# Patient Record
Sex: Male | Born: 1980 | Hispanic: Yes | Marital: Married | Smoking: Never smoker
Health system: Southern US, Community
[De-identification: ages and names within clinical notes are randomized; demographics above are authoritative.]

## PROBLEM LIST (undated history)

## (undated) DIAGNOSIS — F329 Major depressive disorder, single episode, unspecified: Secondary | ICD-10-CM

## (undated) DIAGNOSIS — F419 Anxiety disorder, unspecified: Secondary | ICD-10-CM

## (undated) DIAGNOSIS — F32A Depression, unspecified: Secondary | ICD-10-CM

## (undated) HISTORY — PX: NO PAST SURGERIES: SHX2092

---
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
## 2019-05-05 ENCOUNTER — Other Ambulatory Visit: Payer: Self-pay | Admitting: Internal Medicine

## 2019-05-05 DIAGNOSIS — R109 Unspecified abdominal pain: Secondary | ICD-10-CM

## 2019-05-12 ENCOUNTER — Ambulatory Visit
Admission: RE | Admit: 2019-05-12 | Discharge: 2019-05-12 | Disposition: A | Discharge status: Home / Self Care | Payer: Managed Care, Other (non HMO) | Source: Ambulatory Visit | Attending: Internal Medicine | Admitting: Internal Medicine

## 2019-05-12 DIAGNOSIS — R109 Unspecified abdominal pain: Secondary | ICD-10-CM

## 2019-05-15 ENCOUNTER — Other Ambulatory Visit: Payer: Self-pay | Admitting: Internal Medicine

## 2019-05-15 DIAGNOSIS — N2889 Other specified disorders of kidney and ureter: Secondary | ICD-10-CM

## 2019-05-21 ENCOUNTER — Other Ambulatory Visit: Payer: Self-pay | Admitting: Urology

## 2019-05-21 ENCOUNTER — Other Ambulatory Visit (HOSPITAL_COMMUNITY): Payer: Self-pay | Admitting: Urology

## 2019-05-21 DIAGNOSIS — D49511 Neoplasm of unspecified behavior of right kidney: Secondary | ICD-10-CM

## 2019-05-25 ENCOUNTER — Other Ambulatory Visit (HOSPITAL_COMMUNITY): Payer: Self-pay | Admitting: Urology

## 2019-05-25 DIAGNOSIS — D49519 Neoplasm of unspecified behavior of unspecified kidney: Secondary | ICD-10-CM

## 2019-05-29 ENCOUNTER — Other Ambulatory Visit: Payer: Managed Care, Other (non HMO)

## 2019-06-02 NOTE — Patient Instructions (Addendum)
DUE TO COVID-19 ONLY ONE VISITOR IS ALLOWED TO COME WITH YOU AND STAY IN THE WAITING ROOM ONLY DURING PRE OP AND PROCEDURE DAY OF SURGERY. THE 1 VISITOR MAY VISIT WITH YOU AFTER SURGERY IN YOUR PRIVATE ROOM DURING VISITING HOURS ONLY!  YOU NEED TO HAVE A COVID 19 TEST ON: 06/09/19 @ 11:00 AM , THIS TEST MUST BE DONE BEFORE SURGERY, COME  [place], [address] , [id number].  ([place]) ONCE YOUR COVID TEST IS COMPLETED, PLEASE BEGIN THE QUARANTINE INSTRUCTIONS AS OUTLINED IN YOUR HANDOUT.                Gordon Thomas    Your procedure is scheduled on: 06/12/19   Report to [facility] Main  Entrance   Report to admitting at: 10:30 AM     Call this number if you have problems the morning of surgery [phone]    Remember: Do not eat solid food :After Midnight. Clear liquids from midnight until 9:30 am.    CLEAR LIQUID DIET   Foods Allowed                                                                     Foods Excluded  Coffee and tea, regular and decaf                             liquids that you cannot  Plain Jell-O any favor except red or purple                                           see through such as: Fruit ices (not with fruit pulp)                                     milk, soups, orange juice  Iced Popsicles                                    All solid food Carbonated beverages, regular and diet                                    Cranberry, grape and apple juices Sports drinks like Gatorade Lightly seasoned clear broth or consume(fat free) Sugar, honey syrup  Sample Menu Breakfast                                Lunch                                     Supper Cranberry juice                    Beef broth  Chicken broth Jell-O                                     Grape juice                           Apple juice Coffee or tea                        Jell-O                                      Popsicle                                                 Coffee or tea                        Coffee or tea  _____________________________________________________________________   BRUSH YOUR TEETH MORNING OF SURGERY AND RINSE YOUR MOUTH OUT, NO CHEWING GUM CANDY OR MINTS.     Take these medicines the morning of surgery with A SIP OF WATER: N/A. Use inhalers as usual.                                 You may not have any metal on your body including hair pins and              piercings  Do not wear jewelry, make-up, lotions, powders or perfumes, deodorant             Do not wear nail polish on your fingernails.  Do not shave  48 hours prior to surgery.              Men may shave face and neck.   Do not bring valuables to the hospital. [place].  Contacts, dentures or bridgework may not be worn into surgery.  Leave suitcase in the car. After surgery it may be brought to your room.     Patients discharged the day of surgery will not be allowed to drive home. IF YOU ARE HAVING SURGERY AND GOING HOME THE SAME DAY, YOU MUST HAVE AN ADULT TO DRIVE YOU HOME AND BE WITH YOU FOR 24 HOURS. YOU MAY GO HOME BY TAXI OR UBER OR ORTHERWISE, BUT AN ADULT MUST ACCOMPANY YOU HOME AND STAY WITH YOU FOR 24 HOURS.  Name and phone number of your driver:  Special Instructions: N/A              Please read over the following fact sheets you were given: _____________________________________________________________________             [facility] - Preparing for Surgery Before surgery, you can play an important role.  Because skin is not sterile, your skin needs to be as free of germs as possible.  You can reduce the number of germs on your skin by washing with CHG (chlorahexidine gluconate) soap before surgery.  CHG is an antiseptic  cleaner which kills germs and bonds with the skin to continue killing germs even after washing. Please DO NOT use if you have an allergy to CHG or  antibacterial soaps.  If your skin becomes reddened/irritated stop using the CHG and inform your nurse when you arrive at Short Stay. Do not shave (including legs and underarms) for at least 48 hours prior to the first CHG shower.  You may shave your face/neck. Please follow these instructions carefully:  1.  Shower with CHG Soap the night before surgery and the  morning of Surgery.  2.  If you choose to wash your hair, wash your hair first as usual with your  normal  shampoo.  3.  After you shampoo, rinse your hair and body thoroughly to remove the  shampoo.                           4.  Use CHG as you would any other liquid soap.  You can apply chg directly  to the skin and wash                       Gently with a scrungie or clean washcloth.  5.  Apply the CHG Soap to your body ONLY FROM THE NECK DOWN.   Do not use on face/ open                           Wound or open sores. Avoid contact with eyes, ears mouth and genitals (private parts).                       Wash face,  Genitals (private parts) with your normal soap.             6.  Wash thoroughly, paying special attention to the area where your surgery  will be performed.  7.  Thoroughly rinse your body with warm water from the neck down.  8.  DO NOT shower/wash with your normal soap after using and rinsing off  the CHG Soap.                9.  Pat yourself dry with a clean towel.            10.  Wear clean pajamas.            11.  Place clean sheets on your bed the night of your first shower and do not  sleep with pets. Day of Surgery : Do not apply any lotions/deodorants the morning of surgery.  Please wear clean clothes to the hospital/surgery center.  FAILURE TO FOLLOW THESE INSTRUCTIONS MAY RESULT IN THE CANCELLATION OF YOUR SURGERY PATIENT SIGNATURE_________________________________  NURSE SIGNATURE__________________________________  ________________________________________________________________________

## 2019-06-03 ENCOUNTER — Encounter (HOSPITAL_COMMUNITY)
Admission: RE | Admit: 2019-06-03 | Discharge: 2019-06-03 | Disposition: A | Discharge status: Home / Self Care | Payer: Managed Care, Other (non HMO) | Source: Ambulatory Visit | Attending: Urology | Admitting: Urology

## 2019-06-03 ENCOUNTER — Encounter (HOSPITAL_COMMUNITY): Payer: Self-pay

## 2019-06-03 ENCOUNTER — Other Ambulatory Visit: Payer: Self-pay

## 2019-06-03 DIAGNOSIS — Z01812 Encounter for preprocedural laboratory examination: Secondary | ICD-10-CM | POA: Insufficient documentation

## 2019-06-03 DIAGNOSIS — Z01810 Encounter for preprocedural cardiovascular examination: Secondary | ICD-10-CM | POA: Diagnosis present

## 2019-06-03 HISTORY — DX: Depression, unspecified: F32.A

## 2019-06-03 HISTORY — DX: Anxiety disorder, unspecified: F41.9

## 2019-06-03 LAB — BASIC METABOLIC PANEL
Anion gap: 9 (ref 5–15)
BUN: 12 mg/dL (ref 6–20)
CO2: 26 mmol/L (ref 22–32)
Calcium: 9.3 mg/dL (ref 8.9–10.3)
Chloride: 104 mmol/L (ref 98–111)
Creatinine, Ser: 0.89 mg/dL (ref 0.61–1.24)
GFR calc Af Amer: >60 mL/min (ref >60)
GFR calc non Af Amer: >60 mL/min (ref >60)
Glucose, Bld: 90 mg/dL (ref 70–99)
Potassium: 4.5 mmol/L (ref 3.5–5.1)
Sodium: 139 mmol/L (ref 135–145)

## 2019-06-03 LAB — CBC
HCT: 42 % (ref 39.0–52.0)
Hemoglobin: 13.2 g/dL (ref 13.0–17.0)
MCH: 26.2 pg (ref 26.0–34.0)
MCHC: 31.4 g/dL (ref 30.0–36.0)
MCV: 83.3 fL (ref 80.0–100.0)
Platelets: 459 10*3/uL — ABNORMAL HIGH (ref 150–400)
RBC: 5.04 MIL/uL (ref 4.22–5.81)
RDW: 14.3 % (ref 11.5–15.5)
WBC: 8.4 10*3/uL (ref 4.0–10.5)
nRBC: 0 % (ref 0.0–0.2)

## 2019-06-03 LAB — ABO/RH: ABO/RH(D): O POS

## 2019-06-03 NOTE — Progress Notes (Signed)
PCP -  [name]. Cardiologist -   Chest x-ray -  EKG -  Stress Test -  ECHO -  Cardiac Cath -   Sleep Study -  CPAP -   Fasting Blood Sugar -  Checks Blood Sugar _____ times a day  Blood Thinner Instructions: Aspirin Instructions: Last Dose:  Anesthesia review:   Patient denies shortness of breath, fever, cough and chest pain at PAT appointment   Patient verbalized understanding of instructions that were given to them at the PAT appointment. Patient was also instructed that they will need to review over the PAT instructions again at home before surgery.

## 2019-06-05 ENCOUNTER — Ambulatory Visit (HOSPITAL_COMMUNITY): Payer: Managed Care, Other (non HMO)

## 2019-06-08 ENCOUNTER — Other Ambulatory Visit (HOSPITAL_COMMUNITY): Payer: Managed Care, Other (non HMO)

## 2019-06-09 ENCOUNTER — Other Ambulatory Visit (HOSPITAL_COMMUNITY)
Admission: RE | Admit: 2019-06-09 | Discharge: 2019-06-09 | Disposition: A | Discharge status: Home / Self Care | Payer: Managed Care, Other (non HMO) | Source: Ambulatory Visit | Attending: Urology | Admitting: Urology

## 2019-06-09 ENCOUNTER — Ambulatory Visit (HOSPITAL_COMMUNITY): Payer: Managed Care, Other (non HMO)

## 2019-06-09 ENCOUNTER — Encounter (HOSPITAL_COMMUNITY): Payer: Self-pay

## 2019-06-09 LAB — SARS CORONAVIRUS 2 (TAT 6-24 HRS): SARS Coronavirus 2: NEGATIVE

## 2019-06-10 ENCOUNTER — Ambulatory Visit (HOSPITAL_COMMUNITY)
Admission: RE | Admit: 2019-06-10 | Discharge: 2019-06-10 | Disposition: A | Discharge status: Home / Self Care | Payer: Managed Care, Other (non HMO) | Source: Ambulatory Visit | Attending: Urology | Admitting: Urology

## 2019-06-10 ENCOUNTER — Other Ambulatory Visit: Payer: Self-pay

## 2019-06-10 DIAGNOSIS — D49511 Neoplasm of unspecified behavior of right kidney: Secondary | ICD-10-CM | POA: Insufficient documentation

## 2019-06-10 MED ORDER — GADOBUTROL 1 MMOL/ML IV SOLN
7.0000 mL | Freq: Once | INTRAVENOUS | Status: AC | PRN
  Administered 2019-06-10: 14:00:00 7 mL via INTRAVENOUS

## 2019-06-12 ENCOUNTER — Other Ambulatory Visit: Payer: Self-pay

## 2019-06-12 ENCOUNTER — Inpatient Hospital Stay (HOSPITAL_COMMUNITY)
Admission: RE | Admit: 2019-06-12 | Discharge: 2019-06-14 | DRG: 658 | Disposition: A | Discharge status: Home / Self Care | Payer: Managed Care, Other (non HMO) | Attending: Urology | Admitting: Urology

## 2019-06-12 ENCOUNTER — Encounter (HOSPITAL_COMMUNITY): Payer: Self-pay | Admitting: Urology

## 2019-06-12 ENCOUNTER — Inpatient Hospital Stay (HOSPITAL_COMMUNITY): Payer: Managed Care, Other (non HMO) | Admitting: Certified Registered"

## 2019-06-12 ENCOUNTER — Encounter (HOSPITAL_COMMUNITY)
Admission: RE | Disposition: A | Discharge status: Home / Self Care | Payer: Self-pay | Source: Home / Self Care | Attending: Urology

## 2019-06-12 DIAGNOSIS — N2889 Other specified disorders of kidney and ureter: Secondary | ICD-10-CM | POA: Diagnosis present

## 2019-06-12 DIAGNOSIS — Z8051 Family history of malignant neoplasm of kidney: Secondary | ICD-10-CM | POA: Diagnosis not present

## 2019-06-12 DIAGNOSIS — Z20822 Contact with and (suspected) exposure to covid-19: Secondary | ICD-10-CM | POA: Diagnosis present

## 2019-06-12 DIAGNOSIS — C649 Malignant neoplasm of unspecified kidney, except renal pelvis: Secondary | ICD-10-CM | POA: Diagnosis present

## 2019-06-12 HISTORY — PX: ROBOT ASSISTED LAPAROSCOPIC NEPHRECTOMY: SHX5140

## 2019-06-12 LAB — HEMOGLOBIN AND HEMATOCRIT, BLOOD
HCT: 36.6 % — ABNORMAL LOW (ref 39.0–52.0)
Hemoglobin: 11.6 g/dL — ABNORMAL LOW (ref 13.0–17.0)

## 2019-06-12 LAB — TYPE AND SCREEN
ABO/RH(D): O POS
Antibody Screen: NEGATIVE

## 2019-06-12 SURGERY — NEPHRECTOMY, RADICAL, ROBOT-ASSISTED, LAPAROSCOPIC, ADULT
Anesthesia: General | Laterality: Right

## 2019-06-12 MED ORDER — CEFAZOLIN SODIUM-DEXTROSE 1-4 GM/50ML-% IV SOLN
1.0000 g | Freq: Three times a day (TID) | INTRAVENOUS | Status: AC
  Administered 2019-06-12 – 2019-06-13 (×2): 1 g via INTRAVENOUS
  Filled 2019-06-12 (×2): qty 50

## 2019-06-12 MED ORDER — PROMETHAZINE HCL 12.5 MG PO TABS: 12.5000 mg | ORAL_TABLET | ORAL | 0 refills | Status: DC | PRN

## 2019-06-12 MED ORDER — BELLADONNA ALKALOIDS-OPIUM 16.2-60 MG RE SUPP: 1.0000 | Freq: Four times a day (QID) | RECTAL | Status: DC | PRN

## 2019-06-12 MED ORDER — DIPHENHYDRAMINE HCL 12.5 MG/5ML PO ELIX: 12.5000 mg | ORAL_SOLUTION | Freq: Four times a day (QID) | ORAL | Status: DC | PRN

## 2019-06-12 MED ORDER — STERILE WATER FOR IRRIGATION IR SOLN
Status: DC | PRN
  Administered 2019-06-12: 1000 mL

## 2019-06-12 MED ORDER — ALBUMIN HUMAN 5 % IV SOLN
INTRAVENOUS | Status: AC
  Filled 2019-06-12: qty 500

## 2019-06-12 MED ORDER — DEXTROSE-NACL 5-0.45 % IV SOLN: INTRAVENOUS | Status: DC

## 2019-06-12 MED ORDER — CEFAZOLIN SODIUM-DEXTROSE 2-4 GM/100ML-% IV SOLN
2.0000 g | Freq: Once | INTRAVENOUS | Status: AC
  Administered 2019-06-12: 2 g via INTRAVENOUS
  Filled 2019-06-12: qty 100

## 2019-06-12 MED ORDER — FENTANYL CITRATE (PF) 250 MCG/5ML IJ SOLN
INTRAMUSCULAR | Status: DC | PRN
  Administered 2019-06-12 (×2): 50 ug via INTRAVENOUS
  Administered 2019-06-12: 100 ug via INTRAVENOUS
  Administered 2019-06-12: 50 ug via INTRAVENOUS
  Administered 2019-06-12: 100 ug via INTRAVENOUS
  Administered 2019-06-12 (×3): 50 ug via INTRAVENOUS

## 2019-06-12 MED ORDER — PROMETHAZINE HCL 25 MG/ML IJ SOLN: 6.2500 mg | INTRAMUSCULAR | Status: DC | PRN

## 2019-06-12 MED ORDER — FENTANYL CITRATE (PF) 100 MCG/2ML IJ SOLN
INTRAMUSCULAR | Status: AC
  Filled 2019-06-12: qty 2

## 2019-06-12 MED ORDER — ROCURONIUM BROMIDE 10 MG/ML (PF) SYRINGE
PREFILLED_SYRINGE | INTRAVENOUS | Status: DC | PRN
  Administered 2019-06-12: 10 mg via INTRAVENOUS
  Administered 2019-06-12: 100 mg via INTRAVENOUS

## 2019-06-12 MED ORDER — LACTATED RINGERS IR SOLN
Status: DC | PRN
  Administered 2019-06-12: 1000 mL

## 2019-06-12 MED ORDER — SUGAMMADEX SODIUM 500 MG/5ML IV SOLN
INTRAVENOUS | Status: DC | PRN
  Administered 2019-06-12: 300 mg via INTRAVENOUS

## 2019-06-12 MED ORDER — PHENYLEPHRINE HCL-NACL 10-0.9 MG/250ML-% IV SOLN
INTRAVENOUS | Status: DC | PRN
  Administered 2019-06-12: 25 ug/min via INTRAVENOUS

## 2019-06-12 MED ORDER — SCOPOLAMINE 1 MG/3DAYS TD PT72
MEDICATED_PATCH | TRANSDERMAL | Status: AC
  Filled 2019-06-12: qty 1

## 2019-06-12 MED ORDER — DEXAMETHASONE SODIUM PHOSPHATE 10 MG/ML IJ SOLN
INTRAMUSCULAR | Status: AC
  Filled 2019-06-12: qty 1

## 2019-06-12 MED ORDER — ALBUMIN HUMAN 5 % IV SOLN: INTRAVENOUS | Status: DC | PRN

## 2019-06-12 MED ORDER — FENTANYL CITRATE (PF) 250 MCG/5ML IJ SOLN
INTRAMUSCULAR | Status: AC
  Filled 2019-06-12: qty 5

## 2019-06-12 MED ORDER — HYDROCODONE-ACETAMINOPHEN 5-325 MG PO TABS: 1.0000 | ORAL_TABLET | Freq: Four times a day (QID) | ORAL | 0 refills | Status: DC | PRN

## 2019-06-12 MED ORDER — SODIUM CHLORIDE (PF) 0.9 % IJ SOLN
INTRAMUSCULAR | Status: AC
  Filled 2019-06-12: qty 20

## 2019-06-12 MED ORDER — LACTATED RINGERS IV SOLN: INTRAVENOUS | Status: DC | PRN

## 2019-06-12 MED ORDER — OXYCODONE HCL 5 MG PO TABS
5.0000 mg | ORAL_TABLET | ORAL | Status: DC | PRN
  Administered 2019-06-13 – 2019-06-14 (×6): 5 mg via ORAL
  Filled 2019-06-12 (×6): qty 1

## 2019-06-12 MED ORDER — BUPIVACAINE LIPOSOME 1.3 % IJ SUSP
20.0000 mL | Freq: Once | INTRAMUSCULAR | Status: AC
  Administered 2019-06-12: 20 mL
  Filled 2019-06-12: qty 20

## 2019-06-12 MED ORDER — PHENYLEPHRINE 40 MCG/ML (10ML) SYRINGE FOR IV PUSH (FOR BLOOD PRESSURE SUPPORT)
PREFILLED_SYRINGE | INTRAVENOUS | Status: AC
  Filled 2019-06-12: qty 20

## 2019-06-12 MED ORDER — ROCURONIUM BROMIDE 10 MG/ML (PF) SYRINGE
PREFILLED_SYRINGE | INTRAVENOUS | Status: AC
  Filled 2019-06-12: qty 10

## 2019-06-12 MED ORDER — SODIUM CHLORIDE (PF) 0.9 % IJ SOLN
INTRAMUSCULAR | Status: DC | PRN
  Administered 2019-06-12: 20 mL

## 2019-06-12 MED ORDER — HYDROMORPHONE HCL 1 MG/ML IJ SOLN
0.5000 mg | INTRAMUSCULAR | Status: DC | PRN
  Administered 2019-06-12 (×3): 0.5 mg via INTRAVENOUS
  Administered 2019-06-13 (×4): 1 mg via INTRAVENOUS
  Filled 2019-06-12 (×7): qty 1

## 2019-06-12 MED ORDER — DEXAMETHASONE SODIUM PHOSPHATE 10 MG/ML IJ SOLN
INTRAMUSCULAR | Status: DC | PRN
  Administered 2019-06-12: 10 mg via INTRAVENOUS

## 2019-06-12 MED ORDER — MIDAZOLAM HCL 2 MG/2ML IJ SOLN
INTRAMUSCULAR | Status: DC | PRN
  Administered 2019-06-12: 2 mg via INTRAVENOUS
  Administered 2019-06-12 (×2): 1 mg via INTRAVENOUS

## 2019-06-12 MED ORDER — PROPOFOL 10 MG/ML IV BOLUS
INTRAVENOUS | Status: DC | PRN
  Administered 2019-06-12: 200 mg via INTRAVENOUS

## 2019-06-12 MED ORDER — ALBUMIN HUMAN 5 % IV SOLN
INTRAVENOUS | Status: AC
  Filled 2019-06-12: qty 250

## 2019-06-12 MED ORDER — SCOPOLAMINE 1 MG/3DAYS TD PT72
MEDICATED_PATCH | TRANSDERMAL | Status: DC | PRN
  Administered 2019-06-12: 1 via TRANSDERMAL

## 2019-06-12 MED ORDER — DIPHENHYDRAMINE HCL 50 MG/ML IJ SOLN: 12.5000 mg | Freq: Four times a day (QID) | INTRAMUSCULAR | Status: DC | PRN

## 2019-06-12 MED ORDER — DOCUSATE SODIUM 100 MG PO CAPS
100.0000 mg | ORAL_CAPSULE | Freq: Two times a day (BID) | ORAL | Status: DC
  Administered 2019-06-12 – 2019-06-14 (×4): 100 mg via ORAL
  Filled 2019-06-12 (×4): qty 1

## 2019-06-12 MED ORDER — DOCUSATE SODIUM 100 MG PO CAPS: 100.0000 mg | ORAL_CAPSULE | Freq: Two times a day (BID) | ORAL | Status: DC

## 2019-06-12 MED ORDER — PROPOFOL 500 MG/50ML IV EMUL
INTRAVENOUS | Status: AC
  Filled 2019-06-12: qty 100

## 2019-06-12 MED ORDER — LIDOCAINE 2% (20 MG/ML) 5 ML SYRINGE
INTRAMUSCULAR | Status: DC | PRN
  Administered 2019-06-12: 60 mg via INTRAVENOUS
  Administered 2019-06-12: 100 mg via INTRAVENOUS

## 2019-06-12 MED ORDER — PHENYLEPHRINE HCL (PRESSORS) 10 MG/ML IV SOLN
INTRAVENOUS | Status: AC
  Filled 2019-06-12: qty 1

## 2019-06-12 MED ORDER — ALBUTEROL SULFATE (2.5 MG/3ML) 0.083% IN NEBU: 3.0000 mL | INHALATION_SOLUTION | Freq: Four times a day (QID) | RESPIRATORY_TRACT | Status: DC | PRN

## 2019-06-12 MED ORDER — MIDAZOLAM HCL 2 MG/2ML IJ SOLN
INTRAMUSCULAR | Status: AC
  Filled 2019-06-12: qty 2

## 2019-06-12 MED ORDER — ONDANSETRON HCL 4 MG/2ML IJ SOLN: 4.0000 mg | INTRAMUSCULAR | Status: DC | PRN

## 2019-06-12 MED ORDER — LACTATED RINGERS IV SOLN: INTRAVENOUS | Status: DC

## 2019-06-12 MED ORDER — LIDOCAINE 2% (20 MG/ML) 5 ML SYRINGE
INTRAMUSCULAR | Status: AC
  Filled 2019-06-12: qty 10

## 2019-06-12 MED ORDER — SUGAMMADEX SODIUM 500 MG/5ML IV SOLN
INTRAVENOUS | Status: AC
  Filled 2019-06-12: qty 10

## 2019-06-12 MED ORDER — PROPOFOL 10 MG/ML IV BOLUS
INTRAVENOUS | Status: AC
  Filled 2019-06-12: qty 20

## 2019-06-12 MED ORDER — ONDANSETRON HCL 4 MG/2ML IJ SOLN
INTRAMUSCULAR | Status: AC
  Filled 2019-06-12: qty 4

## 2019-06-12 MED ORDER — ONDANSETRON HCL 4 MG/2ML IJ SOLN
INTRAMUSCULAR | Status: DC | PRN
  Administered 2019-06-12 (×2): 4 mg via INTRAVENOUS

## 2019-06-12 MED ORDER — FENTANYL CITRATE (PF) 100 MCG/2ML IJ SOLN
25.0000 ug | INTRAMUSCULAR | Status: DC | PRN
  Administered 2019-06-12: 25 ug via INTRAVENOUS
  Administered 2019-06-12: 50 ug via INTRAVENOUS

## 2019-06-12 MED ORDER — BACITRACIN-NEOMYCIN-POLYMYXIN 400-5-5000 EX OINT: 1.0000 "application " | TOPICAL_OINTMENT | Freq: Three times a day (TID) | CUTANEOUS | Status: DC | PRN

## 2019-06-12 MED ORDER — ACETAMINOPHEN 325 MG PO TABS: 650.0000 mg | ORAL_TABLET | ORAL | Status: DC | PRN

## 2019-06-12 MED ORDER — ROCURONIUM BROMIDE 10 MG/ML (PF) SYRINGE
PREFILLED_SYRINGE | INTRAVENOUS | Status: AC
  Filled 2019-06-12: qty 20

## 2019-06-12 MED ORDER — EPHEDRINE 5 MG/ML INJ
INTRAVENOUS | Status: AC
  Filled 2019-06-12: qty 10

## 2019-06-12 SURGICAL SUPPLY — 49 items
BAG LAPAROSCOPIC 12 15 PORT 16 (BASKET) ×1 IMPLANT
BAG RETRIEVAL 12/15 (BASKET) ×2
CHLORAPREP W/TINT 26 (MISCELLANEOUS) ×2 IMPLANT
CLIP VESOLOCK LG 6/CT PURPLE (CLIP) ×4 IMPLANT
CLIP VESOLOCK MED LG 6/CT (CLIP) ×4 IMPLANT
CLIP VESOLOCK XL 6/CT (CLIP) ×2 IMPLANT
COVER SURGICAL LIGHT HANDLE (MISCELLANEOUS) ×2 IMPLANT
COVER TIP SHEARS 8 DVNC (MISCELLANEOUS) ×1 IMPLANT
COVER TIP SHEARS 8MM DA VINCI (MISCELLANEOUS) ×1
COVER WAND RF STERILE (DRAPES) IMPLANT
CUTTER ECHEON FLEX ENDO 45 340 (ENDOMECHANICALS) ×2 IMPLANT
DECANTER SPIKE VIAL GLASS SM (MISCELLANEOUS) ×2 IMPLANT
DERMABOND ADVANCED (GAUZE/BANDAGES/DRESSINGS) ×1
DERMABOND ADVANCED .7 DNX12 (GAUZE/BANDAGES/DRESSINGS) ×1 IMPLANT
DRAPE ARM DVNC X/XI (DISPOSABLE) ×4 IMPLANT
DRAPE COLUMN DVNC XI (DISPOSABLE) ×1 IMPLANT
DRAPE DA VINCI XI ARM (DISPOSABLE) ×4
DRAPE DA VINCI XI COLUMN (DISPOSABLE) ×1
DRAPE INCISE IOBAN 66X45 STRL (DRAPES) ×2 IMPLANT
DRAPE SHEET LG 3/4 BI-LAMINATE (DRAPES) ×2 IMPLANT
ELECT REM PT RETURN 15FT ADLT (MISCELLANEOUS) ×2 IMPLANT
GLOVE BIO SURGEON STRL SZ 6.5 (GLOVE) ×2 IMPLANT
GLOVE BIOGEL M STRL SZ7.5 (GLOVE) ×4 IMPLANT
GOWN STRL REUS W/TWL LRG LVL3 (GOWN DISPOSABLE) ×4 IMPLANT
GOWN STRL REUS W/TWL XL LVL3 (GOWN DISPOSABLE) ×4 IMPLANT
HEMOSTAT SURGICEL 4X8 (HEMOSTASIS) ×2 IMPLANT
IRRIG SUCT STRYKERFLOW 2 WTIP (MISCELLANEOUS) ×2
IRRIGATION SUCT STRKRFLW 2 WTP (MISCELLANEOUS) ×1 IMPLANT
KIT BASIN OR (CUSTOM PROCEDURE TRAY) ×2 IMPLANT
KIT TURNOVER KIT A (KITS) IMPLANT
NEEDLE INSUFFLATION 14GA 120MM (NEEDLE) ×2 IMPLANT
PENCIL SMOKE EVACUATOR (MISCELLANEOUS) IMPLANT
PROTECTOR NERVE ULNAR (MISCELLANEOUS) ×4 IMPLANT
SEAL CANN UNIV 5-8 DVNC XI (MISCELLANEOUS) ×4 IMPLANT
SEAL XI 5MM-8MM UNIVERSAL (MISCELLANEOUS) ×4
SET TUBE SMOKE EVAC HIGH FLOW (TUBING) ×2 IMPLANT
SOLUTION ELECTROLUBE (MISCELLANEOUS) ×2 IMPLANT
STAPLE RELOAD 45 WHT (STAPLE) ×2 IMPLANT
STAPLE RELOAD 45MM WHITE (STAPLE) ×2
SUT MNCRL AB 4-0 PS2 18 (SUTURE) ×4 IMPLANT
SUT PDS AB 0 CT1 36 (SUTURE) ×4 IMPLANT
SUT VICRYL 0 UR6 27IN ABS (SUTURE) ×2 IMPLANT
TOWEL OR 17X26 10 PK STRL BLUE (TOWEL DISPOSABLE) ×2 IMPLANT
TOWEL OR NON WOVEN STRL DISP B (DISPOSABLE) ×2 IMPLANT
TRAY FOLEY MTR SLVR 16FR STAT (SET/KITS/TRAYS/PACK) ×2 IMPLANT
TRAY LAPAROSCOPIC (CUSTOM PROCEDURE TRAY) ×2 IMPLANT
TROCAR BLADELESS OPT 5 100 (ENDOMECHANICALS) ×2 IMPLANT
TROCAR XCEL 12X100 BLDLESS (ENDOMECHANICALS) ×2 IMPLANT
WATER STERILE IRR 1000ML POUR (IV SOLUTION) ×2 IMPLANT

## 2019-06-12 NOTE — Transfer of Care (Signed)
Immediate Anesthesia Transfer of Care Note  Patient: Gordon Thomas  Procedure(s) Performed: XI ROBOTIC ASSISTED LAPAROSCOPIC NEPHRECTOMY (Right )  Patient Location: PACU  Anesthesia Type:General  Level of Consciousness: sedated  Airway & Oxygen Therapy: Patient Spontanous Breathing and Patient connected to face mask oxygen  Post-op Assessment: Report given to RN and Post -op Vital signs reviewed and stable  Post vital signs: Reviewed and stable  Last Vitals:  Vitals Value Taken Time  BP 116/88 06/12/19 1501  Temp 37 C 06/12/19 1501  Pulse 77 06/12/19 1503  Resp 11 06/12/19 1503  SpO2 100 % 06/12/19 1503  Vitals shown include unvalidated device data.  Last Pain:  Vitals:   06/12/19 1105  TempSrc: Oral  PainSc:          Complications: No apparent anesthesia complications

## 2019-06-12 NOTE — Op Note (Signed)
Operative Note  Preoperative diagnosis:  1. 8.6 cm right renal mass  Postoperative diagnosis: 1. 8.6 cm right renal mass  Procedure(s): 1. Robot-assisted laparoscopic right radical nephrectomy  Surgeon: Ellison Hughs, MD  Assistants:  Debbrah Alar, [facility]  An assistant was required for this surgical procedure.  The duties of the assistant included but were not limited to suctioning, passing suture, camera manipulation, retraction.  This procedure would not be able to be performed without an Environmental consultant.   Anesthesia:  General  Complications:  None  EBL:  200 mL  Specimens: 1. Right kidney and adrenal gland  Drains/Catheters: 1. Foley catheter  Intraoperative findings:   1. The right renal hilum was hemostatic following ligation 2. No gross evidence of tumor extension beyond the Gerota's fascia   Indication:  Gordon Thomas is a 39 y.o. male with with a solid and enhancing 8.6 cm right renal mass with features concerning for renal cell carcinoma. He has been consented for the above procedures, voices understanding and wishes to proceed.  Description of procedure:  After informed consent was signed, the patient was taken back to the operating room and properly anesthetized.  The patient was then placed in the left lateral decubitus position with all pressure points padded.  The abdomen was then prepped and draped in the usual sterile fashion.  A time-out was then performed.     An 8 mm incision was then made lateral to the right rectus muscle at the level of the right 12th rib.  A Veress needle was then used to access the abdominal cavity.  A saline drop test showed no signs of obstruction and aspiration of the Veress needle revealed no blood or sucus.  The abdominal cavity was then insufflated to 15 mmHg.  An 8 mm robotic trocar was then atraumatically inserted into the abdominal cavity.  The robotic camera was then inserted through the port and inspection of the abdominal cavity  revealed no evidence of adjacent organ or vessel injury. We then placed three additional 8 mm robotic ports and a 12 mm assistant portin such as fashion as to triangulate the right renal hilum.  The robot was then docked into postion.   Using a combination of blunt and cold scissors dissection, the hepatic attachments were released from the abdominal sidewall.  A locking grasper was then inserted through the 5 mm sub-xyphoid port and used to retract the posterior surface of the liver more cephalad.  The white line of Toldt along the ascending colon was then incised, allowing [place] to reflect the colon medially and expose the anterior surface of the right kidney.  The duodenum was then Kocherized medially, which abruptly led [place] to the identification of the inferior vena cava.    Once the colon was adequately mobilized, we moved to the lower pole and identified the gonadal vein and ureter.  The gonadal vein was then left running parallel to the vena cava and the right ureter was reflected anteriorly.  Using cautious cautery, the overlying perihilar attachments were then released.  This yielded visualization of the renal hilum, which included a single right renal vein and a single right renal artery.  The perilymphatic tissue surrounding the right renal artery were carefully released so that the right renal artery was fully encircled.    The right renal artery was then ligated using Hem-o-lok clips and sharply incised. A 45 mm powered endovascular stapler was then used to ligate the right renal vein.  The right hilar stump was hemostatic  following staple ligation.  Hemoclips were then applied to the proximal aspects of the right ureter, which was then sharply incised.   The right adrenal gland was dissected off the anterior surface of the vena cava, the adrenal vein was ligated using hemo-lock clips. The remaining perinephric attachments were then incised using electrocautery. Reinspection of the right  retroperitoneal space revealed excellent hemostasis. Once the right kidney was fully mobile, it was placed in an Endo Catch bag and left in the abdominal cavity.   The 12 mm midline assistant port was then closed using the [employer] technique with a 0 Vicryl suture.  A right lower quadrant Gibson incision was then made in the right kidney was removed within the Endo Catch bag.  The fascia of the external and internal oblique were then closed with a running 0 PDS suture.  The skin incisions were then closed using 4-0 Monocryl.  Dermabond was applied to all skin incisions.  Plan:  Bedrest overnight.  Advance diet as tolerated.  D/c Foley catheter in the AM.
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
## 2019-06-12 NOTE — Anesthesia Postprocedure Evaluation (Signed)
Anesthesia Post Note  Patient: Gordon Thomas  Procedure(s) Performed: XI ROBOTIC ASSISTED LAPAROSCOPIC NEPHRECTOMY (Right )     Patient location during evaluation: PACU Anesthesia Type: General Level of consciousness: sedated Pain management: pain level controlled Vital Signs Assessment: post-procedure vital signs reviewed and stable Respiratory status: spontaneous breathing and respiratory function stable Cardiovascular status: stable Postop Assessment: no apparent nausea or vomiting Anesthetic complications: no    Last Vitals:  Vitals:   06/12/19 1545 06/12/19 1600  BP: (!) 129/92 121/87  Pulse: 85 82  Resp: (!) 22 (!) 21  Temp:  37 C  SpO2: 100% 96%    Last Pain:  Vitals:   06/12/19 1600  TempSrc:   PainSc: 4                  Lamount Bankson DANIEL

## 2019-06-12 NOTE — Discharge Instructions (Signed)

## 2019-06-12 NOTE — Anesthesia Procedure Notes (Signed)
Procedure Name: Intubation Date/Time: 06/12/2019 12:25 PM Performed by: Cynda Familia, CRNA Pre-anesthesia Checklist: Patient identified, Emergency Drugs available, Suction available and Patient being monitored Patient Re-evaluated:Patient Re-evaluated prior to induction Oxygen Delivery Method: Circle System Utilized Preoxygenation: Pre-oxygenation with 100% oxygen Induction Type: IV induction Ventilation: Mask ventilation without difficulty Laryngoscope Size: Miller and 2 Grade View: Grade I Tube type: Oral Number of attempts: 1 Airway Equipment and Method: Stylet Placement Confirmation: ETT inserted through vocal cords under direct vision,  positive ETCO2 and breath sounds checked- equal and bilateral Secured at: 22 cm Tube secured with: Tape Dental Injury: Teeth and Oropharynx as per pre-operative assessment  Comments: Smooth IV induction singer- intubation AM CRNA atraumatic-- teeth and mouth as preop-- bilat BS Singer-- chipping on right front prior to laryngoscopy- unchanged with intubation

## 2019-06-12 NOTE — Anesthesia Preprocedure Evaluation (Addendum)
Anesthesia Evaluation  Patient identified by MRN, date of birth, ID band Patient awake    Reviewed: Allergy & Precautions, Patient's Chart, lab work & pertinent test results  History of Anesthesia Complications Negative for: history of anesthetic complications  Airway Mallampati: II  TM Distance: >3 FB     Dental no notable dental hx. (+) Dental Advisory Given   Pulmonary neg pulmonary ROS,    Pulmonary exam normal        Cardiovascular negative cardio ROS Normal cardiovascular exam     Neuro/Psych PSYCHIATRIC DISORDERS Anxiety Depression negative neurological ROS     GI/Hepatic negative GI ROS, Neg liver ROS,   Endo/Other  negative endocrine ROS  Renal/GU Renal mass     Musculoskeletal negative musculoskeletal ROS (+)   Abdominal   Peds  Hematology negative hematology ROS (+)   Anesthesia Other Findings Day of surgery medications reviewed with the patient.  Reproductive/Obstetrics                            Anesthesia Physical Anesthesia Plan  ASA: II  Anesthesia Plan: General   Post-op Pain Management:    Induction: Intravenous  PONV Risk Score and Plan: 4 or greater and Ondansetron, Dexamethasone, Scopolamine patch - Pre-op and Midazolam  Airway Management Planned: Oral ETT  Additional Equipment:   Intra-op Plan:   Post-operative Plan: Extubation in OR  Informed Consent: I have reviewed the patients History and Physical, chart, labs and discussed the procedure including the risks, benefits and alternatives for the proposed anesthesia with the patient or authorized representative who has indicated his/her understanding and acceptance.     Dental advisory given  Plan Discussed with: CRNA and Anesthesiologist  Anesthesia Plan Comments:        Anesthesia Quick Evaluation

## 2019-06-12 NOTE — Anesthesia Procedure Notes (Signed)
Date/Time: 06/12/2019 2:57 PM Performed by: Cynda Familia, CRNA Oxygen Delivery Method: Simple face mask Placement Confirmation: positive ETCO2 and breath sounds checked- equal and bilateral Dental Injury: Teeth and Oropharynx as per pre-operative assessment

## 2019-06-13 LAB — BASIC METABOLIC PANEL
Anion gap: 11 (ref 5–15)
BUN: 12 mg/dL (ref 6–20)
CO2: 26 mmol/L (ref 22–32)
Calcium: 8.7 mg/dL — ABNORMAL LOW (ref 8.9–10.3)
Chloride: 98 mmol/L (ref 98–111)
Creatinine, Ser: 1.15 mg/dL (ref 0.61–1.24)
GFR calc Af Amer: >60 mL/min (ref >60)
GFR calc non Af Amer: >60 mL/min (ref >60)
Glucose, Bld: 133 mg/dL — ABNORMAL HIGH (ref 70–99)
Potassium: 4.1 mmol/L (ref 3.5–5.1)
Sodium: 135 mmol/L (ref 135–145)

## 2019-06-13 LAB — HEMOGLOBIN AND HEMATOCRIT, BLOOD
HCT: 38.1 % — ABNORMAL LOW (ref 39.0–52.0)
Hemoglobin: 12.2 g/dL — ABNORMAL LOW (ref 13.0–17.0)

## 2019-06-13 MED ORDER — CHLORHEXIDINE GLUCONATE CLOTH 2 % EX PADS
6.0000 | MEDICATED_PAD | Freq: Every day | CUTANEOUS | Status: DC
  Administered 2019-06-13: 6 via TOPICAL

## 2019-06-13 NOTE — Progress Notes (Signed)
1 Day Post-Op Subjective: The patient is doing well.  No nausea or vomiting. Pain is adequately controlled.  Objective: Vital signs in last 24 hours: Temp:  [97.4 F (36.3 C)-98.6 F (37 C)] 97.7 F (36.5 C) (04/10 0541) Pulse Rate:  [60-87] 61 (04/10 0541) Resp:  [14-23] 19 (04/10 0459) BP: (101-135)/(69-95) 101/69 (04/10 0541) SpO2:  [96 %-100 %] 100 % (04/10 0541) Weight:  [74.3 kg] 74.3 kg (04/10 0459)  Intake/Output from previous day: 04/09 0701 - 04/10 0700 In: 3109.5 [I.V.:2559.5; IV Piggyback:550] Out: [id number] [Urine:1150; Blood:200] Intake/Output this shift: Total I/O In: -  Out: 1225 [Urine:1225]  Physical Exam:  General: Alert and oriented. CV: Regular rate Lungs: Clear  GI: Soft, Nondistended. Appropriately tender Incisions: Clean and dry. Urine: Clear Extremities: Nontender, no erythema, no edema.  Lab Results: Recent Labs    06/12/19 1514 06/13/19 0529  HGB 11.6* 12.2*  HCT 36.6* 38.1*          Recent Labs    06/13/19 0529  CREATININE 1.15           Results for orders placed or performed during the hospital encounter of 06/12/19 (from the past 24 hour(s))  Hemoglobin and hematocrit, blood     Status: Abnormal   Collection Time: 06/12/19  3:14 PM  Result Value Ref Range   Hemoglobin 11.6 (L) 13.0 - 17.0 g/dL   HCT 36.6 (L) 39.0 - [id number] %  Basic metabolic panel     Status: Abnormal   Collection Time: 06/13/19  5:29 AM  Result Value Ref Range   Sodium 135 135 - 145 mmol/L   Potassium 4.1 3.5 - 5.1 mmol/L   Chloride 98 98 - 111 mmol/L   CO2 26 22 - 32 mmol/L   Glucose, Bld 133 (H) 70 - 99 mg/dL   BUN 12 6 - 20 mg/dL   Creatinine, Ser 1.15 0.61 - 1.24 mg/dL   Calcium 8.7 (L) 8.9 - 10.3 mg/dL   GFR calc non Af Amer >60 >60 mL/min   GFR calc Af Amer >60 >60 mL/min   Anion gap 11 5 - 15  Hemoglobin and hematocrit, blood     Status: Abnormal   Collection Time: 06/13/19  5:29 AM  Result Value Ref Range   Hemoglobin 12.2 (L) 13.0 - 17.0 g/dL   HCT 38.1 (L) 39.0 - 52.0 %    Assessment/Plan: POD# 1 s/p robotic nephrectomy.  1) Ambulate, Incentive spirometry 2) Advance diet as tolerated 3) Transition to oral pain medication 4) D/C urethral catheter, trial of void  Likely discharge later today   LOS: 1 day   Haskel Schroeder 06/13/2019, 11:29 AM

## 2019-06-14 MED ORDER — ALUM & MAG HYDROXIDE-SIMETH 200-200-20 MG/5ML PO SUSP
15.0000 mL | ORAL | Status: DC | PRN
  Administered 2019-06-14 (×2): 15 mL via ORAL
  Filled 2019-06-14 (×2): qty 30

## 2019-06-14 MED ORDER — PANTOPRAZOLE SODIUM 40 MG PO TBEC: 40.0000 mg | DELAYED_RELEASE_TABLET | Freq: Once | ORAL | Status: DC

## 2019-06-14 NOTE — Discharge Summary (Signed)
Date of admission: 06/12/2019  Date of discharge: 06/14/2019  Admission diagnosis: Renal mass  Discharge diagnosis: Renal mass  History and Physical: For full details, please see admission history and physical. Briefly, Gordon Thomas is a 39 y.o. gentleman with renal mass. After discussing management/treatment options, he elected to proceed with surgical treatment.  Hospital Course: Gordon Thomas was taken to the operating room on 06/12/2019 and underwent a robotic assisted laparoscopic nephrectomy. He tolerated this procedure well and without complications. Postoperatively, he was able to be transferred to a regular hospital room following recovery from anesthesia.  He was able to begin ambulating the night of surgery. He remained hemodynamically stable overnight.  He had excellent urine output, foley was removed, and he passed trial of void.  He was transitioned to oral pain medication, tolerated a liquid diet, passed gas, and had met all discharge criteria and was able to be discharged home on POD#2.  Laboratory values:  Recent Labs    06/12/19 1514 06/13/19 0529  HGB 11.6* 12.2*  HCT 36.6* 38.1*    Disposition: Home  Discharge instruction: Reviewed with patient.   Discharge medications:   Allergies as of 06/14/2019   No Known Allergies     Medication List    TAKE these medications   albuterol 108 (90 Base) MCG/ACT inhaler Commonly known as: VENTOLIN HFA Inhale 1-2 puffs into the lungs every 6 (six) hours as needed for wheezing or shortness of breath.   docusate sodium 100 MG capsule Commonly known as: COLACE Take 1 capsule (100 mg total) by mouth 2 (two) times daily.   HYDROcodone-acetaminophen 5-325 MG tablet Commonly known as: Norco Take 1-2 tablets by mouth every 6 (six) hours as needed.   promethazine 12.5 MG tablet Commonly known as: PHENERGAN Take 1 tablet (12.5 mg total) by mouth every 4 (four) hours as needed for nausea or vomiting.       Followup: With Dr Lovena Neighbours  for pathology review and post op check.

## 2019-06-14 NOTE — Progress Notes (Signed)
Pt discharged from the unit via wheelchair. Discharge instructions were reviewed with the pt and family members at beside. No questions or concerns at this time.

## 2019-06-14 NOTE — Plan of Care (Signed)

## 2019-06-14 NOTE — Progress Notes (Signed)
2 Days Post-Op Subjective: The patient is doing well.  No nausea or vomiting. Pain is adequately controlled. Passed trial of void. Passing gas.   Objective: Vital signs in last 24 hours: Temp:  [97.7 F (36.5 C)-98.2 F (36.8 C)] 98 F (36.7 C) (04/11 0447) Pulse Rate:  [67-70] 67 (04/11 0447) Resp:  [16-18] 18 (04/10 2113) BP: (108-118)/(65-70) 108/65 (04/11 0447) SpO2:  [96 %-100 %] 96 % (04/11 0447)  Intake/Output from previous day: 04/10 0701 - 04/11 0700 In: 240 [P.O.:240] Out: 1225 [Urine:1225] Intake/Output this shift: Total I/O In: 120 [P.O.:120] Out: -   Physical Exam:  General: Alert and oriented. CV: Regular rate Lungs: Clear  GI: Soft, Nondistended. Appropriately tender Incisions: Clean and dry. Urine: Voiding spontaneously Extremities: Nontender, no erythema, no edema.  Lab Results: Recent Labs    06/12/19 1514 06/13/19 0529  HGB 11.6* 12.2*  HCT 36.6* 38.1*          Recent Labs    06/13/19 0529  CREATININE 1.15           No results found for this or any previous visit (from the past 24 hour(s)).  Assessment/Plan: POD# 2 s/p robotic nephrectomy.  1) Ambulate, Incentive spirometry 2) Regular diet 3) PO pain medication   Plan for discharge today.    LOS: 2 days   Haskel Schroeder 06/14/2019, 10:45 AM

## 2019-06-16 LAB — SURGICAL PATHOLOGY

## 2019-07-06 ENCOUNTER — Telehealth: Payer: Self-pay | Admitting: Oncology

## 2019-07-06 NOTE — Telephone Encounter (Signed)
Received a new pt referral from Dr. Lovena Neighbours at [facility] to discuss possible adjuvant treatment of his aggressive RCC. Pt has been cld and scheduled to see Dr. Alen Blew on 5/5 at 11am. Pt aware to arrive 15 minutes early.

## 2019-07-08 ENCOUNTER — Inpatient Hospital Stay: Payer: Managed Care, Other (non HMO) | Attending: Oncology | Admitting: Oncology

## 2019-07-08 ENCOUNTER — Other Ambulatory Visit: Payer: Self-pay

## 2019-07-08 VITALS — BP 133/73 | HR 96 | Temp 98.5°F | Resp 18 | Ht 65.0 in | Wt 158.7 lb

## 2019-07-08 DIAGNOSIS — Z79899 Other long term (current) drug therapy: Secondary | ICD-10-CM | POA: Diagnosis not present

## 2019-07-08 DIAGNOSIS — F418 Other specified anxiety disorders: Secondary | ICD-10-CM | POA: Diagnosis not present

## 2019-07-08 DIAGNOSIS — C641 Malignant neoplasm of right kidney, except renal pelvis: Secondary | ICD-10-CM

## 2019-07-08 DIAGNOSIS — Z905 Acquired absence of kidney: Secondary | ICD-10-CM

## 2019-07-08 NOTE — Progress Notes (Signed)
Reason for the request:    Renal cell carcinoma  HPI: I was asked by Dr. Lovena Neighbours to evaluate Gordon Thomas for evaluation of renal cell carcinoma.  He is a 39 year old man without any significant comorbid conditions presented with abdominal pain and underwent abdominal ultrasound on May 12, 2019 which showed a solid mass arising from the upper mid right kidney measuring 8.2 x 6.0 x 6.4 cm suspicious for neoplasm.  MRI on April 7 of the abdomen showed a large solid enhancing exophytic mass of the right kidney without involvement of the right renal vein or IVC.  No evidence of lymphadenopathy or metastatic disease.  Based on these findings, he was evaluated by Dr. Lovena Neighbours and underwent robot-assisted laparoscopic right radical nephrectomy on June 12, 2019.  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  Final pathological staging was T3AN0 with 0 out of 2 lymph nodes involved.  He has recovered reasonably well from his surgery without any postoperative complications.  He is ambulating driving without any decline in ability to do so.  He has resumed work remotely and has not been back in the office.  Denies any hematuria or hematochezia or melena.  He does not report any headaches, blurry vision, syncope or seizures. Does not report any fevers, chills or sweats.  Does not report any cough, wheezing or hemoptysis.  Does not report any chest pain, palpitation, orthopnea or leg edema.  Does not report any nausea, vomiting or abdominal pain.  Does not report any constipation or diarrhea.  Does not report any skeletal complaints.    Does not report frequency, urgency or hematuria.  Does not report any skin rashes or lesions. Does not report any heat or cold intolerance.  Does not report any lymphadenopathy or petechiae.  Does not report any anxiety or depression.  Remaining review of systems is negative.    Past  Medical History:  Diagnosis Date  . Anxiety   . Depression   :  Past Surgical History:  Procedure Laterality Date  . NO PAST SURGERIES    . ROBOT ASSISTED LAPAROSCOPIC NEPHRECTOMY Right 06/12/2019   Procedure: XI ROBOTIC ASSISTED LAPAROSCOPIC NEPHRECTOMY;  Surgeon: Ceasar Mons, MD;  Location: [facility];  Service: Urology;  Laterality: Right;  :   Current Outpatient Medications:  .  albuterol (VENTOLIN HFA) 108 (90 Base) MCG/ACT inhaler, Inhale 1-2 puffs into the lungs every 6 (six) hours as needed for wheezing or shortness of breath., Disp: , Rfl:  .  docusate sodium (COLACE) 100 MG capsule, Take 1 capsule (100 mg total) by mouth 2 (two) times daily., Disp: , Rfl:  .  HYDROcodone-acetaminophen (NORCO) 5-325 MG tablet, Take 1-2 tablets by mouth every 6 (six) hours as needed., Disp: 30 tablet, Rfl: 0 .  promethazine (PHENERGAN) 12.5 MG tablet, Take 1 tablet (12.5 mg total) by mouth every 4 (four) hours as needed for nausea or vomiting., Disp: 15 tablet, Rfl: 0:  No Known Allergies:  No family history on file.:  Social History   Socioeconomic History  . Marital status: Married    Spouse name: Not on file  . Number of children: Not on file  . Years of education: Not on file  . Highest education level: Not on file  Occupational History  . Not on file  Tobacco Use  . Smoking status: Never Smoker  . Smokeless tobacco: Never Used  Substance and Sexual Activity  .  Alcohol use: Yes    Comment: [facility].  . Drug use: Never  . Sexual activity: Not on file  Other Topics Concern  . Not on file  Social History Narrative  . Not on file   Social Determinants of Health   Financial Resource Strain:   . Difficulty of Paying Living Expenses:   Food Insecurity:   . Worried About Charity fundraiser in the Last Year:   . Arboriculturist in the Last Year:   Transportation Needs:   . Film/video editor (Medical):   Marland Kitchen Lack of Transportation (Non-Medical):   Physical Activity:    . Days of Exercise per Week:   . Minutes of Exercise per Session:   Stress:   . Feeling of Stress :   Social Connections:   . Frequency of Communication with Friends and Family:   . Frequency of Social Gatherings with Friends and Family:   . Attends Religious Services:   . Active Member of Clubs or Organizations:   . Attends Archivist Meetings:   Marland Kitchen Marital Status:   Intimate Partner Violence:   . Fear of Current or Ex-Partner:   . Emotionally Abused:   Marland Kitchen Physically Abused:   . Sexually Abused:   :  Pertinent items are noted in HPI.  Exam: Blood pressure 133/73, pulse 96, temperature 98.5 F (36.9 C), temperature source Temporal, resp. rate 18, height 5\' 5"  (1.651 m), weight 158 lb 11.2 oz (72 kg), SpO2 100 %.  ECOG 0  General appearance: alert and cooperative appeared without distress. Head: atraumatic without any abnormalities. Eyes: conjunctivae/corneas clear. PERRL.  Sclera anicteric. Throat: lips, mucosa, and tongue normal; without oral thrush or ulcers. Resp: clear to auscultation bilaterally without rhonchi, wheezes or dullness to percussion. Cardio: regular rate and rhythm, S1, S2 normal, no murmur, click, rub or gallop GI: soft, non-tender; bowel sounds normal; no masses,  no organomegaly Skin: Skin color, texture, turgor normal. No rashes or lesions Lymph nodes: Cervical, supraclavicular, and axillary nodes normal. Neurologic: Grossly normal without any motor, sensory or deep tendon reflexes. Musculoskeletal: No joint deformity or effusion.   MR ABDOMEN W WO CONTRAST  Result Date: 06/10/2019 CLINICAL DATA:  RIGHT renal mass EXAM: MRI ABDOMEN WITHOUT AND WITH CONTRAST TECHNIQUE: Multiplanar multisequence MR imaging of the abdomen was performed both before and after the administration of intravenous contrast. CONTRAST:  85mL GADAVIST GADOBUTROL 1 MMOL/ML IV SOLN COMPARISON:  Ultrasound 05/12/2019 FINDINGS: Lower chest:  Lung bases are clear Hepatobiliary:  There no focal hepatic lesion. Gallbladder normal. Biliary tree normal. Pancreas: Normal pancreatic parenchymal intensity. No ductal dilatation or inflammation. Spleen: Normal spleen. Adrenals/urinary tract: Large multilobulated enhancing solid mass extending from the lateral aspect of the RIGHT kidney. Mass measures 8.6 cm in craniocaudad dimension (image 48/series 20) and 6.1 x 5.5 cm in axial dimension (image 58/18). Mass is partially exophytic from the RIGHT kidney and approximates the medial aspect of the RIGHT hepatic lobe seen best on coronal image 49/20. The mass appears contained within the pararenal fascia. There is evidence of vascular recruitment along the inferior margin of the mass (image 73/102). The mass does not involve the RIGHT renal hilum. The RIGHT renal vein is normal. The IVC is normal. Adrenal glands are normal.  The LEFT kidney is normal. No retroperitoneal lymphadenopathy. Stomach/Bowel: Stomach and limited of the small bowel is unremarkable Vascular/Lymphatic: Abdominal aortic normal caliber. No retroperitoneal periportal lymphadenopathy. Musculoskeletal: No aggressive osseous lesion IMPRESSION: 1. Large solid enhancing mass partially  exophytic from the RIGHT kidney is consistent with renal neoplasm. 2. While the mass does abut the RIGHT hepatic lobe, mass appears contained within the pararenal fascia. 3. No involvement of the RIGHT renal vein or IVC. 4. No lymphadenopathy or metastatic disease. 5. Vascular recruitment along the inferior margin of the RIGHT renal mass. 6. Normal LEFT kidney. Electronically Signed   By: Suzy Bouchard M.D.   On: 06/10/2019 16:48    Assessment and Plan:    39 year old with:  1.  Renal cell carcinoma diagnosed in March 2021.  He was found to have a large right kidney mass after presenting with abdominal pain.  He underwent right radical nephrectomy completed on June 12, 2019.  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  He has no evidence of metastatic disease.  The natural course of this disease was reviewed at this time.  Treatment options was also discussed.  The role of adjuvant therapy for resected renal cell carcinoma was discussed today in detail.  The role of oral targeted therapy in the adjuvant setting for kidney cancer using Votrient and Sutent were discussed.  Overall the studies have been overall disappointing without any overall survival benefit.  There is a progression free survival marginal benefit associated with 1 year of Sutent.  Risks and benefits of using this medication was discussed today in this particular setting.  Complication associated with this medication to include nausea, fatigue, diarrhea weighed against the marginal benefit associated with improvement in progression free survival.  After discussion today, I have recommended active surveillance at this time over the marginal benefit associated with these agents.  The role of immunotherapy has not been clear in this particular setting.  No FDA approved drug outside of a clinical trial has been available at this time.   He is scheduled to have repeat imaging studies in 4 months with Dr. Lovena Neighbours which I recommended that he continues to do routinely.  He develops advanced disease in the future we will evaluate him for systemic therapy.  2.  Genetic consideration: He would be a reasonable candidate for genetic counseling given his family history and personal history of malignancy at a young age.  He will consider that option and let me know in the future.  3.  follow-up: To see him in the future as needed.  He will continue to have active surveillance and imaging study under the care of Dr. Lovena Neighbours.  60  minutes were dedicated to this visit. The time was spent on reviewing laboratory data, imaging studies, discussing treatment options, and answering questions regarding future  plan.      A copy of this consult has been forwarded to the requesting physician.

## 2019-11-12 ENCOUNTER — Telehealth: Payer: Self-pay | Admitting: Oncology

## 2019-11-12 NOTE — Telephone Encounter (Signed)
Scheduled appt per 9/9 sch msg - pt aware of appt date and time

## 2019-11-16 ENCOUNTER — Other Ambulatory Visit: Payer: Self-pay

## 2019-11-16 ENCOUNTER — Inpatient Hospital Stay: Payer: Managed Care, Other (non HMO) | Attending: Oncology | Admitting: Oncology

## 2019-11-16 VITALS — BP 129/94 | HR 85 | Temp 99.1°F | Resp 18 | Ht 65.0 in | Wt 164.0 lb

## 2019-11-16 DIAGNOSIS — C641 Malignant neoplasm of right kidney, except renal pelvis: Secondary | ICD-10-CM

## 2019-11-16 DIAGNOSIS — R918 Other nonspecific abnormal finding of lung field: Secondary | ICD-10-CM | POA: Diagnosis not present

## 2019-11-16 DIAGNOSIS — Z905 Acquired absence of kidney: Secondary | ICD-10-CM | POA: Insufficient documentation

## 2019-11-16 DIAGNOSIS — Z79899 Other long term (current) drug therapy: Secondary | ICD-10-CM | POA: Diagnosis not present

## 2019-11-16 DIAGNOSIS — D1803 Hemangioma of intra-abdominal structures: Secondary | ICD-10-CM | POA: Diagnosis not present

## 2019-11-16 DIAGNOSIS — C649 Malignant neoplasm of unspecified kidney, except renal pelvis: Secondary | ICD-10-CM | POA: Insufficient documentation

## 2019-11-16 NOTE — Progress Notes (Signed)
Hematology and Oncology Follow Up Visit  Gordon Thomas [id number] 1980-08-29 39 y.o. 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  He has documented pulmonary nodules under evaluation.   Prior Therapy: He is status post right radical nephrectomy completed on June 12, 2019.  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   Current therapy: Under evaluation for additional therapy.  Interim History: [name] returns today for a follow-up visit.  Since the last visit, he reports no major changes in his health.  He does report some symptoms of reactive airway disease with cough and mucus production he gets exposed to smoke.  He denies any hemoptysis or shortness of breath.  He denies any abdominal pain or early satiety.  His performance status quality of life remained excellent.     Medications: I have reviewed the patient's current medications.  Current Outpatient Medications  Medication Sig Dispense Refill  . albuterol (VENTOLIN HFA) 108 (90 Base) MCG/ACT inhaler Inhale 1-2 puffs into the lungs every 6 (six) hours as needed for wheezing or shortness of breath.    . docusate sodium (COLACE) 100 MG capsule Take 1 capsule (100 mg total) by mouth 2 (two) times daily.    Marland Kitchen HYDROcodone-acetaminophen (NORCO) 5-325 MG tablet Take 1-2 tablets by mouth every 6 (six) hours as needed. 30 tablet 0  . promethazine (PHENERGAN) 12.5 MG tablet Take 1 tablet (12.5 mg total) by mouth every 4 (four) hours as needed for nausea or vomiting. 15 tablet 0   No current facility-administered medications for this visit.     Allergies: No Known Allergies    Physical Exam: Blood pressure (!) 129/94, pulse 85, temperature 99.1 F (37.3 C), temperature source Tympanic, resp. rate 18, height 5\' 5"  (1.651 m), weight 164 lb (74.4 kg), SpO2 100 %.   ECOG:   0    General appearance: Comfortable appearing without any discomfort Head: Normocephalic without any trauma Oropharynx: Mucous membranes are moist and pink without any thrush or ulcers. Eyes: Pupils are equal and round reactive to light. Lymph nodes: No cervical, supraclavicular, inguinal or axillary lymphadenopathy.   Heart:regular rate and rhythm.  S1 and S2 without leg edema. Lung: Clear without any rhonchi or wheezes.  No dullness to percussion. Abdomin: Soft, nontender, nondistended with good bowel sounds.  No hepatosplenomegaly. Musculoskeletal: No joint deformity or effusion.  Full range of motion noted. Neurological: No deficits noted on motor, sensory and deep tendon reflex exam. Skin: No petechial rash or dryness.  Appeared moist.  Psychiatric: Mood and affect appeared appropriate.     Lab Results: Lab Results  Component Value Date   WBC 8.4 06/03/2019   HGB 12.2 (L) 06/13/2019   HCT 38.1 (L) 06/13/2019   MCV 83.3 06/03/2019   PLT 459 (H) 06/03/2019     Chemistry      Component Value Date/Time   NA 135 06/13/2019 0529   K 4.1 06/13/2019 0529   CL 98 06/13/2019 0529   CO2 26 06/13/2019 0529   BUN 12 06/13/2019 0529   CREATININE 1.15 06/13/2019 0529      Component Value Date/Time   CALCIUM 8.7 (L) 06/13/2019 0529       Radiological Studies:   IMPRESSION: 1. Ten small pulmonary nodules in the lungs. Although not entirely specific, the appearance is concerning for potential metastatic disease in this setting. These nodules are below sensitive  PET-CT size thresholds and are probably too small for biopsy. Close CT chest surveillance is suggested. 2. Along the posterior margin of the right hepatic lobe and adjacent to the nephrectomy site, there is heterogeneous capsular density with some enhancement in this vicinity. Although possibly from postoperative capsular scarring causing this enhancement, local tumor recurrence cannot be excluded. There is also  some minimal nodularity along the upper margin of the nephrectomy site, and again, early tumor recurrence cannot be excluded. 3. Incidental small hemangioma anteriorly in the left hepatic lobe.    Impression and Plan:   39 year old with:  1.  Renal cell carcinoma diagnosed in March 2021.  He was found to have T3N0 clear-cell renal cell carcinoma with some rhabdoid features.  He status post radical nephrectomy at that time.  CT scan obtained on August 23 of 2021 was personally reviewed today and discussed with the patient.  He does have very small pulmonary nodules although they are prominent and suspicious for malignancy.  They are measuring less than 0.5 cm without any evidence of metastatic disease anywhere  Management options moving forward were discussed.  Treating with systemic therapy at this point versus continued active surveillance with short interval imaging studies were reviewed.  Risks and benefits of both approaches were discussed.  Interval scanning can offer a time lapse in terms of regrowth of these nodules which can help confirm the etiology of malignancy.  Obtaining tissue biopsy will be very difficult given the size and the location of these small lesions.  After discussion today, he is agreeable with this plan.   If his CT scan shows enlarging pulmonary nodules that warrant treatment, systemic therapy with ipilimumab and nivolumab would be a reasonable option for him.  Alternative options including combination immunotherapy with oral targeted therapy could be alternative.   2.  Follow-up: Will be in the next 2 months to repeat imaging studies.  30  minutes were spent on this encounter.  The time was dedicated to reviewing imaging studies, discussing treatment options and differential diagnosis of these findings.   Zola Button, MD 9/13/20219:57 AM

## 2019-12-07 ENCOUNTER — Telehealth: Payer: Self-pay | Admitting: *Deleted

## 2019-12-07 NOTE — Telephone Encounter (Signed)
Received call from pt inquiring about his upcoming scans.  He is to have CT scans done prior to his visit with Dr. Alen Blew in early November.  Pt states that radiology scheduling might have tried to call but he doesn't have vm set up yet. Provided him with the number to radiology scheduling.  [phone] He stated he will call

## 2019-12-25 ENCOUNTER — Telehealth: Payer: Self-pay | Admitting: Oncology

## 2019-12-25 NOTE — Telephone Encounter (Signed)
Scheduled per los, patient has been called and notified. 

## 2019-12-30 ENCOUNTER — Telehealth: Payer: Self-pay | Admitting: Oncology

## 2019-12-30 ENCOUNTER — Telehealth: Payer: Self-pay

## 2019-12-30 NOTE — Telephone Encounter (Signed)
Called pt per 10/27 sch msg - pt is aware of new apts and reschedule in November.

## 2019-12-30 NOTE — Telephone Encounter (Signed)
-----   Message from Wyatt Portela, MD sent at 12/30/2019  8:44 AM EDT ----- Regarding: FW: [employer] Denial Please let the patient know. We will postpone scan few weeks to satisfy insurance requirement.  I sent a message to scheduling.  Thanks. ----- Message ----- From: Roosvelt Maser Sent: 12/30/2019   8:29 AM EDT To: Rolland Bimler, RN, Wyatt Portela, MD, # Subject: RE: [employer]leene Overlie Denial                             Appt cancelled, please inform your patient  ----- Message ----- From: Wyatt Portela, MD Sent: 12/30/2019   8:12 AM EDT To: Hinda Kehr, RN, # Subject: RE: Carleene Overlie Denial                             Will cancel for now. Thanks  FS ----- Message ----- From: Aundria Rud Sent: 12/29/2019   9:22 AM EDT To: Hinda Kehr, RN, # Subject: Carleene Overlie Denial                                 Good Morning Dr. Alen Blew,  Carleene Overlie has denied the following CT Scans for this patient [phone] and [phone] due to frequency. Please see below.    Guidelines support a repeat chest CT scan in the evaluation of pulmonary nodules that would reasonably metastasize to the lungs from previous or current malignancy, with a frequency of three months, then six months, then 12 months, and then at 24 months from the time of discovery. The clinical information provided does not describe this imaging timeframe and, therefore, the request is not indicated at this time.  Your records show that you have been treated for cancer in your kidney(s). The request cannot be approved because: It is only supported once three to six months after surgery, then once per three months for the next three years, and then once a year until year five.   Please let me know how you would like to move forward. Thanks, [employer]

## 2019-12-30 NOTE — Telephone Encounter (Signed)
Called patient and made him aware that this scan has been postponed for a few weeks due to insurance denial. Patient verbalized understanding and knows to expect a call from scheduling.

## 2019-12-31 ENCOUNTER — Other Ambulatory Visit: Payer: Self-pay | Admitting: Oncology

## 2019-12-31 DIAGNOSIS — C641 Malignant neoplasm of right kidney, except renal pelvis: Secondary | ICD-10-CM

## 2020-01-05 ENCOUNTER — Inpatient Hospital Stay: Payer: Managed Care, Other (non HMO)

## 2020-01-05 ENCOUNTER — Ambulatory Visit (HOSPITAL_COMMUNITY): Payer: Managed Care, Other (non HMO)

## 2020-01-06 ENCOUNTER — Inpatient Hospital Stay: Payer: Managed Care, Other (non HMO) | Admitting: Oncology

## 2020-01-26 ENCOUNTER — Telehealth: Payer: Self-pay

## 2020-01-26 NOTE — Telephone Encounter (Signed)
-----   Message from Elliot Gault sent at 01/26/2020  2:09 PM EST ----- Regarding: Facility Change Good Afternoon - patients ins will not cover the scans to be done here at [facility]. However, they have approved [facility] as the site. Santiago Glad, could you rs these at [facility] and Vivien Rota can you cancel them here?  Thank you  Velna Hatchet

## 2020-01-26 NOTE — Telephone Encounter (Signed)
TC to Pt to inform him that date and location of Scans changed to [place] on 02/16/20 at 1130 Pt given address and phone number of location. Pt. Verbalized understanding. No further problem or concerns noted

## 2020-02-02 ENCOUNTER — Other Ambulatory Visit: Payer: Self-pay | Admitting: Oncology

## 2020-02-02 ENCOUNTER — Ambulatory Visit (HOSPITAL_COMMUNITY): Payer: Managed Care, Other (non HMO)

## 2020-02-02 ENCOUNTER — Inpatient Hospital Stay: Payer: Managed Care, Other (non HMO)

## 2020-02-02 DIAGNOSIS — C641 Malignant neoplasm of right kidney, except renal pelvis: Secondary | ICD-10-CM

## 2020-02-03 ENCOUNTER — Inpatient Hospital Stay: Payer: Managed Care, Other (non HMO) | Admitting: Oncology

## 2020-02-12 ENCOUNTER — Other Ambulatory Visit: Payer: Self-pay | Admitting: Medical Oncology

## 2020-02-16 ENCOUNTER — Ambulatory Visit
Admission: RE | Admit: 2020-02-16 | Discharge: 2020-02-16 | Disposition: A | Discharge status: Home / Self Care | Payer: Managed Care, Other (non HMO) | Source: Ambulatory Visit | Attending: Oncology | Admitting: Oncology

## 2020-02-16 ENCOUNTER — Inpatient Hospital Stay: Payer: Managed Care, Other (non HMO) | Attending: Oncology

## 2020-02-16 ENCOUNTER — Other Ambulatory Visit: Payer: Managed Care, Other (non HMO)

## 2020-02-16 ENCOUNTER — Other Ambulatory Visit: Payer: Self-pay

## 2020-02-16 ENCOUNTER — Inpatient Hospital Stay: Admission: RE | Admit: 2020-02-16 | Payer: Managed Care, Other (non HMO) | Source: Ambulatory Visit

## 2020-02-16 DIAGNOSIS — C649 Malignant neoplasm of unspecified kidney, except renal pelvis: Secondary | ICD-10-CM | POA: Diagnosis present

## 2020-02-16 DIAGNOSIS — Z5112 Encounter for antineoplastic immunotherapy: Secondary | ICD-10-CM | POA: Insufficient documentation

## 2020-02-16 DIAGNOSIS — C7970 Secondary malignant neoplasm of unspecified adrenal gland: Secondary | ICD-10-CM | POA: Diagnosis not present

## 2020-02-16 DIAGNOSIS — C641 Malignant neoplasm of right kidney, except renal pelvis: Secondary | ICD-10-CM

## 2020-02-16 DIAGNOSIS — C7800 Secondary malignant neoplasm of unspecified lung: Secondary | ICD-10-CM | POA: Diagnosis present

## 2020-02-16 DIAGNOSIS — Z905 Acquired absence of kidney: Secondary | ICD-10-CM | POA: Diagnosis not present

## 2020-02-16 LAB — CMP (CANCER CENTER ONLY)
ALT: 23 U/L (ref 0–44)
AST: 23 U/L (ref 15–41)
Albumin: 4.1 g/dL (ref 3.5–5.0)
Alkaline Phosphatase: 90 U/L (ref 38–126)
Anion gap: 9 (ref 5–15)
BUN: 15 mg/dL (ref 6–20)
CO2: 27 mmol/L (ref 22–32)
Calcium: 9.9 mg/dL (ref 8.9–10.3)
Chloride: 105 mmol/L (ref 98–111)
Creatinine: 1.39 mg/dL — ABNORMAL HIGH (ref 0.61–1.24)
GFR, Estimated: >60 mL/min (ref >60)
Glucose, Bld: 91 mg/dL (ref 70–99)
Potassium: 4.3 mmol/L (ref 3.5–5.1)
Sodium: 141 mmol/L (ref 135–145)
Total Bilirubin: 0.7 mg/dL (ref 0.3–1.2)
Total Protein: 8.6 g/dL — ABNORMAL HIGH (ref 6.5–8.1)

## 2020-02-16 LAB — CBC WITH DIFFERENTIAL (CANCER CENTER ONLY)
Abs Immature Granulocytes: 0.01 10*3/uL (ref 0.00–0.07)
Basophils Absolute: 0.1 10*3/uL (ref 0.0–0.1)
Basophils Relative: 1 %
Eosinophils Absolute: 0.2 10*3/uL (ref 0.0–0.5)
Eosinophils Relative: 3 %
HCT: 45.2 % (ref 39.0–52.0)
Hemoglobin: 15.9 g/dL (ref 13.0–17.0)
Immature Granulocytes: 0 %
Lymphocytes Relative: 47 %
Lymphs Abs: 3.8 10*3/uL (ref 0.7–4.0)
MCH: 30.4 pg (ref 26.0–34.0)
MCHC: 35.2 g/dL (ref 30.0–36.0)
MCV: 86.4 fL (ref 80.0–100.0)
Monocytes Absolute: 0.7 10*3/uL (ref 0.1–1.0)
Monocytes Relative: 8 %
Neutro Abs: 3.3 10*3/uL (ref 1.7–7.7)
Neutrophils Relative %: 41 %
Platelet Count: 337 10*3/uL (ref 150–400)
RBC: 5.23 MIL/uL (ref 4.22–5.81)
RDW: 12.2 % (ref 11.5–15.5)
WBC Count: 8.1 10*3/uL (ref 4.0–10.5)
nRBC: 0 % (ref 0.0–0.2)

## 2020-02-16 MED ORDER — IOPAMIDOL (ISOVUE-300) INJECTION 61%
100.0000 mL | Freq: Once | INTRAVENOUS | Status: AC | PRN
  Administered 2020-02-16: 100 mL via INTRAVENOUS

## 2020-02-17 ENCOUNTER — Inpatient Hospital Stay: Payer: Managed Care, Other (non HMO) | Admitting: Oncology

## 2020-02-17 DIAGNOSIS — C641 Malignant neoplasm of right kidney, except renal pelvis: Secondary | ICD-10-CM | POA: Diagnosis not present

## 2020-02-17 DIAGNOSIS — Z7189 Other specified counseling: Secondary | ICD-10-CM | POA: Diagnosis not present

## 2020-02-17 DIAGNOSIS — Z5112 Encounter for antineoplastic immunotherapy: Secondary | ICD-10-CM | POA: Diagnosis not present

## 2020-02-17 MED ORDER — PROCHLORPERAZINE MALEATE 10 MG PO TABS: 10.0000 mg | ORAL_TABLET | Freq: Four times a day (QID) | ORAL | 0 refills | Status: DC | PRN

## 2020-02-17 NOTE — Progress Notes (Signed)
START ON PATHWAY REGIMEN - Renal Cell     A cycle is every 21 days:     Nivolumab      Ipilimumab    A cycle is every 28 days:     Nivolumab   **Always confirm dose/schedule in your pharmacy ordering system**  Patient Characteristics: Stage IV/Metastatic Disease, Clear Cell, First Line, Intermediate or Poor Risk Therapeutic Status: Stage IV/Metastatic Disease Histology: Clear Cell Line of Therapy: First Line Risk Status: Intermediate Risk Intent of Therapy: Non-Curative / Palliative Intent, Discussed with Patient 

## 2020-02-17 NOTE — Progress Notes (Signed)
Hematology and Oncology Follow Up Visit  Gordon Thomas [id number] [birth year] 39 y.o. 02/17/2020 3:32 PM Gordon Thomas, MDShelton, Gordon Millin, MD   Principle Diagnosis: 39 year old with kidney cancer diagnosed and March 2021.  He was found to have T3N0 clear-cell histology and developed pulmonary nodules in September 2021.   Prior Therapy: He is status post right radical nephrectomy completed on June 12, 2019.  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   Current therapy: Active surveillance and under consideration for therapy.  Interim History: Gordon Thomas returns today for a follow-up visit.  Since the last visit, he reports no major changes in his health.  He does report some mild respiratory complaints predominantly sensation of burning with deep breath.  He remains active and attends activities of daily living.  Continues to work full-time without any decline in ability to do so.  He denies any cough, wheezing or hemoptysis.  He denies any flank pain or weight loss.     Medications: Updated on review. Current Outpatient Medications  Medication Sig Dispense Refill  . albuterol (VENTOLIN HFA) 108 (90 Base) MCG/ACT inhaler Inhale 1-2 puffs into the lungs every 6 (six) hours as needed for wheezing or shortness of breath.    . docusate sodium (COLACE) 100 MG capsule Take 1 capsule (100 mg total) by mouth 2 (two) times daily.    Marland Kitchen HYDROcodone-acetaminophen (NORCO) 5-325 MG tablet Take 1-2 tablets by mouth every 6 (six) hours as needed. 30 tablet 0  . promethazine (PHENERGAN) 12.5 MG tablet Take 1 tablet (12.5 mg total) by mouth every 4 (four) hours as needed for nausea or vomiting. 15 tablet 0   No current facility-administered medications for this visit.     Allergies: No Known Allergies    Physical Exam: Blood pressure (!) 165/100, pulse 94, temperature 99.1 F (37.3 C), temperature source Tympanic, resp. rate 16, height 5\' 5"   (1.651 m), weight 164 lb 12.8 oz (74.8 kg), SpO2 100 %.   ECOG:  0    General appearance: Alert, awake without any distress. Head: Atraumatic without abnormalities Oropharynx: Without any thrush or ulcers. Eyes: No scleral icterus. Lymph nodes: No lymphadenopathy noted in the cervical, supraclavicular, or axillary nodes Heart:regular rate and rhythm, without any murmurs or gallops.   Lung: Clear to auscultation without any rhonchi, wheezes or dullness to percussion. Abdomin: Soft, nontender without any shifting dullness or ascites. Musculoskeletal: No clubbing or cyanosis. Neurological: No motor or sensory deficits. Skin: No rashes or lesions.       Lab Results: Lab Results  Component Value Date   WBC 8.1 02/16/2020   HGB 15.9 02/16/2020   HCT 45.2 02/16/2020   MCV 86.4 02/16/2020   PLT 337 02/16/2020     Chemistry      Component Value Date/Time   NA 141 02/16/2020 1005   K 4.3 02/16/2020 1005   CL 105 02/16/2020 1005   CO2 27 02/16/2020 1005   BUN 15 02/16/2020 1005   CREATININE 1.39 (H) 02/16/2020 1005      Component Value Date/Time   CALCIUM 9.9 02/16/2020 1005   ALKPHOS 90 02/16/2020 1005   AST 23 02/16/2020 1005   ALT 23 02/16/2020 1005   BILITOT 0.7 02/16/2020 1005       IMPRESSION: 1. Multiple new and enlarged pulmonary nodules throughout the lungs. 2. Multiple new heterogeneously enhancing pleural nodules about the right lung base. 3. There is a heterogeneously enhancing soft tissue nodule in the vicinity  of the right adrenal gland, which enlarged, now discrete and measurable. 4. 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  These results will be called to the ordering clinician or representative by the Radiologist Assistant, and communication documented in the PACS or [employer].    Impression and Plan:   39 year old with:  1.   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  He developed what appears to be stage IV disease with pulmonary and adrenal nodule involvement.   CT scan obtained on February 16, 2020 was personally reviewed and showed progression of his pulmonary nodules with increased in number and the size of these nodules.  He has also area of soft tissue nodule within area of the adrenal gland which could indicate metastatic disease.  The natural course of this disease and treatment options were reviewed at this time.  Given the clear progression at this time and his high risk features I recommended proceeding with treatment at this time.  Treatment options include combination immunotherapy utilizing ipilimumab and nivolumab versus combination of oral targeted therapy with nivolumab or Pembrolizumab.  Risks and benefits of all these approaches were discussed.  Given the fact that he has no symptoms and relatively low volume disease, I favor proceeding with immunotherapy.  Complication associated with treatment including immune mediated complications such as pneumonitis, hepatitis, thyroid disease and rarely hypophysitis, myositis and myalgias.  Complications of oral targeted therapy was also reviewed including diarrhea, hypertension and hand-foot syndrome.   After discussion today, he is agreeable to proceed.   2.  Immune mediated complications: We will monitor thyroid function periodically while on treatment.  3.  IV access: Peripheral veins we currently in use and will defer the option of a Port-A-Cath.  4.  Antiemetics: Prescription for Compazine will be available to him.  5.  Goals of care and prognosis discussion: His disease is advanced and long-term disease control is expected given his young age and limited progression sites.  The likelihood of cure is low however.  6.  Follow-up: Will be in the near future to start therapy.  30  minutes were dedicated to this encounter.  Time was  spent on reviewing his disease status update, reviewing treatment options, reviewing imaging studies and outlining complications related therapies.   Zola Button, MD 12/15/20213:32 PM

## 2020-02-24 ENCOUNTER — Other Ambulatory Visit: Payer: Self-pay

## 2020-02-24 ENCOUNTER — Inpatient Hospital Stay: Payer: Managed Care, Other (non HMO)

## 2020-02-24 DIAGNOSIS — C641 Malignant neoplasm of right kidney, except renal pelvis: Secondary | ICD-10-CM

## 2020-02-25 ENCOUNTER — Encounter: Payer: Self-pay | Admitting: *Deleted

## 2020-02-25 NOTE — Progress Notes (Signed)
[facility]  Initial Assessment   Gordon Thomas is a 39 y.o. year old male . Clinical Social Work was referred by distress screening protocol for assessment of psychosocial needs.   SDOH (Social Determinants of Health) assessments performed: No   Distress Screen completed: Yes ONCBCN DISTRESS SCREENING 02/24/2020  Screening Type Initial Screening  Distress experienced in past week (1-10) 10  Emotional problem type Depression;Nervousness/Anxiety;Adjusting to illness;Feeling hopeless  Spiritual/Religous concerns type Relating to God;Facing my mortality  Physical Problem type Sleep/insomnia  Referral to clinical social work Yes     Family/Social Information:  . Housing Arrangement: patient lives alone  . Family members/support persons in your life? Family and Friends/Colleagues - very supportive friends (specifically Gordon Thomas, Gordon Thomas, and other friends) and family (sisters) . Transportation concerns: no  . Employment: Working full time. - works as a Medical sales representative. He reported a supportive work environment . Income source: Employment . Financial concerns: No o Type of concern: None . Food access concerns: no . Religious or spiritual practice: did not specify . Medication Concerns: no  . Services Currently in place:  None identified  Coping/ Adjustment to diagnosis: . Patient understands treatment plan and what happens next? yes . Concerns about diagnosis and/or treatment: Pain or discomfort during procedures and generally the uncertainty related to a cancer diagnosis; not knowing what to expect regarding symptoms, prognosis, etc. . Patient reported stressors: Anxiety and Adjusting to my illness . Hopes and priorities: Gordon Thomas expressed hope to control cancer through immunotherapy and provide a good quality of life for as long as possible. . Patient enjoys time with family/ friends . Current coping skills/ strengths: Ability for insight,  Average or above average intelligence, Capable of independent living, Communication skills, Financial means, Motivation for treatment/growth and Supportive family/friends    SUMMARY: Current SDOH Barriers:  . None identified  Clinical Social Work Clinical Goal(s):  Marland Kitchen None identified  Interventions: . Discussed common feeling and emotions when being diagnosed with cancer, and the importance of support during treatment . Informed patient of the support team roles and support services at [facility] . Provided CSW contact information and encouraged patient to call with any questions or concerns . No CSW interventions needed at this time   Follow Up Plan: Patient will contact CSW with any support or resource needs and CSW will see patient on upcoming infusion appointment Patient verbalizes understanding of plan: Yes    Kennith Center , LCSW

## 2020-03-01 ENCOUNTER — Other Ambulatory Visit: Payer: Self-pay

## 2020-03-01 ENCOUNTER — Inpatient Hospital Stay: Payer: Managed Care, Other (non HMO)

## 2020-03-01 ENCOUNTER — Encounter: Payer: Self-pay | Admitting: Oncology

## 2020-03-01 VITALS — BP 148/82 | HR 89 | Temp 99.1°F | Resp 18

## 2020-03-01 DIAGNOSIS — C641 Malignant neoplasm of right kidney, except renal pelvis: Secondary | ICD-10-CM

## 2020-03-01 DIAGNOSIS — Z5112 Encounter for antineoplastic immunotherapy: Secondary | ICD-10-CM | POA: Diagnosis not present

## 2020-03-01 LAB — CBC WITH DIFFERENTIAL (CANCER CENTER ONLY)
Abs Immature Granulocytes: 0.03 10*3/uL (ref 0.00–0.07)
Basophils Absolute: 0.1 10*3/uL (ref 0.0–0.1)
Basophils Relative: 1 %
Eosinophils Absolute: 0.3 10*3/uL (ref 0.0–0.5)
Eosinophils Relative: 3 %
HCT: 44.1 % (ref 39.0–52.0)
Hemoglobin: 15.6 g/dL (ref 13.0–17.0)
Immature Granulocytes: 0 %
Lymphocytes Relative: 46 %
Lymphs Abs: 4.1 10*3/uL — ABNORMAL HIGH (ref 0.7–4.0)
MCH: 30.5 pg (ref 26.0–34.0)
MCHC: 35.4 g/dL (ref 30.0–36.0)
MCV: 86.3 fL (ref 80.0–100.0)
Monocytes Absolute: 0.8 10*3/uL (ref 0.1–1.0)
Monocytes Relative: 10 %
Neutro Abs: 3.4 10*3/uL (ref 1.7–7.7)
Neutrophils Relative %: 40 %
Platelet Count: 342 10*3/uL (ref 150–400)
RBC: 5.11 MIL/uL (ref 4.22–5.81)
RDW: 12.1 % (ref 11.5–15.5)
WBC Count: 8.6 10*3/uL (ref 4.0–10.5)
nRBC: 0 % (ref 0.0–0.2)

## 2020-03-01 LAB — CMP (CANCER CENTER ONLY)
ALT: 40 U/L (ref 0–44)
AST: 30 U/L (ref 15–41)
Albumin: 4 g/dL (ref 3.5–5.0)
Alkaline Phosphatase: 90 U/L (ref 38–126)
Anion gap: 6 (ref 5–15)
BUN: 15 mg/dL (ref 6–20)
CO2: 27 mmol/L (ref 22–32)
Calcium: 9.5 mg/dL (ref 8.9–10.3)
Chloride: 105 mmol/L (ref 98–111)
Creatinine: 1.39 mg/dL — ABNORMAL HIGH (ref 0.61–1.24)
GFR, Estimated: >60 mL/min
Glucose, Bld: 91 mg/dL (ref 70–99)
Potassium: 3.9 mmol/L (ref 3.5–5.1)
Sodium: 138 mmol/L (ref 135–145)
Total Bilirubin: 0.7 mg/dL (ref 0.3–1.2)
Total Protein: 8.3 g/dL — ABNORMAL HIGH (ref 6.5–8.1)

## 2020-03-01 LAB — TSH: TSH: 1.007 u[IU]/mL (ref 0.320–4.118)

## 2020-03-01 MED ORDER — DIPHENHYDRAMINE HCL 50 MG/ML IJ SOLN
INTRAMUSCULAR | Status: AC
  Filled 2020-03-01: qty 1

## 2020-03-01 MED ORDER — SODIUM CHLORIDE 0.9 % IV SOLN
1.0000 mg/kg | Freq: Once | INTRAVENOUS | Status: AC
  Administered 2020-03-01: 75 mg via INTRAVENOUS
  Filled 2020-03-01: qty 15

## 2020-03-01 MED ORDER — SODIUM CHLORIDE 0.9 % IV SOLN: 3.0000 mg/kg | Freq: Once | INTRAVENOUS | Status: DC

## 2020-03-01 MED ORDER — DIPHENHYDRAMINE HCL 50 MG/ML IJ SOLN
25.0000 mg | Freq: Once | INTRAMUSCULAR | Status: AC
  Administered 2020-03-01: 25 mg via INTRAVENOUS

## 2020-03-01 MED ORDER — SODIUM CHLORIDE 0.9 % IV SOLN
Freq: Once | INTRAVENOUS | Status: AC
  Filled 2020-03-01: qty 250

## 2020-03-01 MED ORDER — SODIUM CHLORIDE 0.9 % IV SOLN
3.2100 mg/kg | Freq: Once | INTRAVENOUS | Status: AC
  Administered 2020-03-01: 240 mg via INTRAVENOUS
  Filled 2020-03-01: qty 24

## 2020-03-01 MED ORDER — FAMOTIDINE IN NACL 20-0.9 MG/50ML-% IV SOLN
20.0000 mg | Freq: Once | INTRAVENOUS | Status: AC
  Administered 2020-03-01: 20 mg via INTRAVENOUS

## 2020-03-01 MED ORDER — FAMOTIDINE IN NACL 20-0.9 MG/50ML-% IV SOLN
INTRAVENOUS | Status: AC
  Filled 2020-03-01: qty 50

## 2020-03-01 NOTE — Patient Instructions (Addendum)
[facility] Discharge Instructions for Patients Receiving Chemotherapy  Today you received the following chemotherapy agents: opdivo, yervoy  To help prevent nausea and vomiting after your treatment, we encourage you to take your nausea medication as directed.    If you develop nausea and vomiting that is not controlled by your nausea medication, call the clinic.   BELOW ARE SYMPTOMS THAT SHOULD BE REPORTED IMMEDIATELY:  *FEVER GREATER THAN 100.5 F  *CHILLS WITH OR WITHOUT FEVER  NAUSEA AND VOMITING THAT IS NOT CONTROLLED WITH YOUR NAUSEA MEDICATION  *UNUSUAL SHORTNESS OF BREATH  *UNUSUAL BRUISING OR BLEEDING  TENDERNESS IN MOUTH AND THROAT WITH OR WITHOUT PRESENCE OF ULCERS  *URINARY PROBLEMS  *BOWEL PROBLEMS  UNUSUAL RASH Items with * indicate a potential emergency and should be followed up as soon as possible.  Feel free to call the clinic should you have any questions or concerns. The clinic phone number is [phone].  Please show the CHEMO ALERT CARD at check-in to the Emergency Department and triage nurse.  Nivolumab injection What is this medicine? NIVOLUMAB (nye VOL ue mab) is a monoclonal antibody. It is used to treat colon cancer, esophageal cancer, head and neck cancer, Hodgkin lymphoma, kidney cancer, liver cancer, lung cancer, mesothelioma, melanoma, and urothelial cancer. This medicine may be used for other purposes; ask your health care provider or pharmacist if you have questions. COMMON BRAND NAME(S): Opdivo What should I tell my health care provider before I take this medicine? They need to know if you have any of these conditions:  diabetes  immune system problems  kidney disease  liver disease  lung disease  organ transplant  stomach or intestine problems  thyroid disease  an unusual or allergic reaction to nivolumab, other medicines, foods, dyes, or preservatives  pregnant or trying to get  pregnant  breast-feeding How should I use this medicine? This medicine is for infusion into a vein. It is given by a health care professional in a hospital or clinic setting. A special MedGuide will be given to you before each treatment. Be sure to read this information carefully each time. Talk to your pediatrician regarding the use of this medicine in children. While this drug may be prescribed for children as young as 12 years for selected conditions, precautions do apply. Overdosage: If you think you have taken too much of this medicine contact a poison control center or emergency room at once. NOTE: This medicine is only for you. Do not share this medicine with others. What if I miss a dose? It is important not to miss your dose. Call your doctor or health care professional if you are unable to keep an appointment. What may interact with this medicine? Interactions have not been studied. Give your health care provider a list of all the medicines, herbs, non-prescription drugs, or dietary supplements you use. Also tell them if you smoke, drink alcohol, or use illegal drugs. Some items may interact with your medicine. This list may not describe all possible interactions. Give your health care provider a list of all the medicines, herbs, non-prescription drugs, or dietary supplements you use. Also tell them if you smoke, drink alcohol, or use illegal drugs. Some items may interact with your medicine. What should I watch for while using this medicine? This drug may make you feel generally unwell. Continue your course of treatment even though you feel ill unless your doctor tells you to stop. You may need blood work done while you are taking   this medicine. Do not become pregnant while taking this medicine or for 5 months after stopping it. Women should inform their doctor if they wish to become pregnant or think they might be pregnant. There is a potential for serious side effects to an unborn  child. Talk to your health care professional or pharmacist for more information. Do not breast-feed an infant while taking this medicine or for 5 months after stopping it. What side effects may I notice from receiving this medicine? Side effects that you should report to your doctor or health care professional as soon as possible:  allergic reactions like skin rash, itching or hives, swelling of the face, lips, or tongue  breathing problems  blood in the urine  bloody or watery diarrhea or black, tarry stools  changes in emotions or moods  changes in vision  chest pain  cough  dizziness  feeling faint or lightheaded, falls  fever, chills  headache with fever, neck stiffness, confusion, loss of memory, sensitivity to light, hallucination, loss of contact with reality, or seizures  joint pain  mouth sores  redness, blistering, peeling or loosening of the skin, including inside the mouth  severe muscle pain or weakness  signs and symptoms of high blood sugar such as dizziness; dry mouth; dry skin; fruity breath; nausea; stomach pain; increased hunger or thirst; increased urination  signs and symptoms of kidney injury like trouble passing urine or change in the amount of urine  signs and symptoms of liver injury like dark yellow or brown urine; general ill feeling or flu-like symptoms; light-colored stools; loss of appetite; nausea; right upper belly pain; unusually weak or tired; yellowing of the eyes or skin  swelling of the ankles, feet, hands  trouble passing urine or change in the amount of urine  unusually weak or tired  weight gain or loss Side effects that usually do not require medical attention (report to your doctor or health care professional if they continue or are bothersome):  bone pain  constipation  decreased appetite  diarrhea  muscle pain  nausea, vomiting  tiredness This list may not describe all possible side effects. Call your doctor  for medical advice about side effects. You may report side effects to FDA at 1-800-FDA-1088. Where should I keep my medicine? This drug is given in a hospital or clinic and will not be stored at home. NOTE: This sheet is a summary. It may not cover all possible information. If you have questions about this medicine, talk to your doctor, pharmacist, or health care provider.  2020 Elsevier/Gold Standard (2018-12-09 10:04:50)  Ipilimumab injection What is this medicine? IPILIMUMAB (IP i LIM ue mab) is a monoclonal antibody. It is used to treat colorectal cancer, kidney cancer, liver cancer, lung cancer, melanoma, and mesothelioma. This medicine may be used for other purposes; ask your health care provider or pharmacist if you have questions. COMMON BRAND NAME(S): YERVOY What should I tell my health care provider before I take this medicine? They need to know if you have any of these conditions:  Addison's disease  blood in your stools (black or tarry stools) or if you have blood in your vomit  eye disease, vision problems  history of pancreatitis  history of stomach bleeding  immune system problems  inflammatory bowel disease  kidney disease  liver disease  lupus  myasthenia gravis  organ transplant  rheumatoid arthritis  sarcoidosis  stomach or intestine problems  thyroid disease  tingling of the fingers or toes, or  other nerve disorder  an unusual or allergic reaction to ipilimumab, other medicines, foods, dyes, or preservatives  pregnant or trying to get pregnant  breast-feeding How should I use this medicine? This medicine is for infusion into a vein. It is given by a health care professional in a hospital or clinic setting. A special MedGuide will be given to you before each treatment. Be sure to read this information carefully each time. Talk to your pediatrician regarding the use of this medicine in children. While this drug may be prescribed for children  as young as 12 years for selected conditions, precautions do apply. Overdosage: If you think you have taken too much of this medicine contact a poison control center or emergency room at once. NOTE: This medicine is only for you. Do not share this medicine with others. What if I miss a dose? It is important not to miss your dose. Call your doctor or health care professional if you are unable to keep an appointment. What may interact with this medicine? Interactions are not expected. This list may not describe all possible interactions. Give your health care provider a list of all the medicines, herbs, non-prescription drugs, or dietary supplements you use. Also tell them if you smoke, drink alcohol, or use illegal drugs. Some items may interact with your medicine. What should I watch for while using this medicine? Tell your doctor or healthcare professional if your symptoms do not start to get better or if they get worse. Do not become pregnant while taking this medicine or for 3 months after stopping it. Women should inform their doctor if they wish to become pregnant or think they might be pregnant. There is a potential for serious side effects to an unborn child. Talk to your health care professional or pharmacist for more information. Do not breast-feed an infant while taking this medicine or for 3 months after the last dose. Your condition will be monitored carefully while you are receiving this medicine. You may need blood work done while you are taking this medicine. What side effects may I notice from receiving this medicine? Side effects that you should report to your doctor or health care professional as soon as possible:  allergic reactions like skin rash, itching or hives, swelling of the face, lips, or tongue  black, tarry stools  bloody or watery diarrhea  changes in vision  dizziness  eye pain  fast, irregular heartbeat  feeling anxious  feeling faint or lightheaded,  falls  nausea, vomiting  pain, tingling, numbness in the hands or feet  redness, blistering, peeling or loosening of the skin, including inside the mouth  signs and symptoms of liver injury like dark yellow or brown urine; general ill feeling or flu-like symptoms; light-colored stools; loss of appetite; nausea; right upper belly pain; unusually weak or tired; yellowing of the eyes or skin  unusual bleeding or bruising Side effects that usually do not require medical attention (report to your doctor or health care professional if they continue or are bothersome):  headache  loss of appetite  trouble sleeping This list may not describe all possible side effects. Call your doctor for medical advice about side effects. You may report side effects to FDA at 1-800-FDA-1088. Where should I keep my medicine? This drug is given in a hospital or clinic and will not be stored at home. NOTE: This sheet is a summary. It may not cover all possible information. If you have questions about this medicine, talk to your  doctor, pharmacist, or health care provider.  2020 Elsevier/Gold Standard (2018-12-09 10:56:55)

## 2020-03-01 NOTE — Progress Notes (Signed)
Met with patient at registration to introduce myself as Arboriculturist and to offer available resources.  Discussed one-time $1000 Radio broadcast assistant to [employer] with personal expenses while going through treatment.  Also discussed possible copay assistance if needed if insurance leaves with a balance for treatment drugs.   Gave him my card if interested in applying and for any additional financial questions or concerns.

## 2020-03-03 ENCOUNTER — Telehealth: Payer: Self-pay

## 2020-03-03 NOTE — Telephone Encounter (Signed)
Called patient and let him know that Dr. Clelia Croft does not have any objections to OTC medications. Patient verbalized understanding.

## 2020-03-03 NOTE — Telephone Encounter (Signed)
-----   Message from Benjiman Core, MD sent at 03/03/2020  1:33 PM EST ----- No objections to OTC medications. Thanks ----- Message ----- From: Virgie Dad, RN Sent: 03/03/2020   1:24 PM EST To: Benjiman Core, MD  Patient completed D1 C1 Opdivo/Yervoy on 12/28. He states he started having cold symptoms yesterday evening- runny nose, mild cough, and a raspy voice with drainage in his throat. Denies fever. He wanted to let you know and he wants to know if you are ok with him taking Nyquil or Robitussin.  Dorene Grebe

## 2020-03-22 ENCOUNTER — Inpatient Hospital Stay: Payer: Managed Care, Other (non HMO)

## 2020-03-22 ENCOUNTER — Inpatient Hospital Stay: Payer: Managed Care, Other (non HMO) | Admitting: Oncology

## 2020-03-23 ENCOUNTER — Telehealth: Payer: Self-pay | Admitting: Oncology

## 2020-03-23 NOTE — Telephone Encounter (Signed)
R/s appt per 1/18 sch msg - pt is aware of appt date and time   

## 2020-03-25 ENCOUNTER — Inpatient Hospital Stay: Payer: Managed Care, Other (non HMO) | Attending: Oncology

## 2020-03-25 ENCOUNTER — Inpatient Hospital Stay: Payer: Managed Care, Other (non HMO)

## 2020-03-25 ENCOUNTER — Other Ambulatory Visit: Payer: Self-pay

## 2020-03-25 ENCOUNTER — Inpatient Hospital Stay (HOSPITAL_BASED_OUTPATIENT_CLINIC_OR_DEPARTMENT_OTHER): Payer: Managed Care, Other (non HMO) | Admitting: Oncology

## 2020-03-25 VITALS — BP 139/97 | HR 97 | Temp 97.8°F | Resp 18 | Ht 65.0 in | Wt 166.7 lb

## 2020-03-25 DIAGNOSIS — C641 Malignant neoplasm of right kidney, except renal pelvis: Secondary | ICD-10-CM | POA: Diagnosis not present

## 2020-03-25 DIAGNOSIS — C7800 Secondary malignant neoplasm of unspecified lung: Secondary | ICD-10-CM | POA: Insufficient documentation

## 2020-03-25 DIAGNOSIS — Z79899 Other long term (current) drug therapy: Secondary | ICD-10-CM | POA: Insufficient documentation

## 2020-03-25 DIAGNOSIS — Z5112 Encounter for antineoplastic immunotherapy: Secondary | ICD-10-CM | POA: Diagnosis present

## 2020-03-25 DIAGNOSIS — C649 Malignant neoplasm of unspecified kidney, except renal pelvis: Secondary | ICD-10-CM | POA: Diagnosis present

## 2020-03-25 DIAGNOSIS — E079 Disorder of thyroid, unspecified: Secondary | ICD-10-CM | POA: Insufficient documentation

## 2020-03-25 DIAGNOSIS — Z905 Acquired absence of kidney: Secondary | ICD-10-CM | POA: Insufficient documentation

## 2020-03-25 LAB — CMP (CANCER CENTER ONLY)
ALT: 29 U/L (ref 0–44)
AST: 25 U/L (ref 15–41)
Albumin: 4.2 g/dL (ref 3.5–5.0)
Alkaline Phosphatase: 89 U/L (ref 38–126)
Anion gap: 11 (ref 5–15)
BUN: 17 mg/dL (ref 6–20)
CO2: 25 mmol/L (ref 22–32)
Calcium: 9.2 mg/dL (ref 8.9–10.3)
Chloride: 105 mmol/L (ref 98–111)
Creatinine: 1.37 mg/dL — ABNORMAL HIGH (ref 0.61–1.24)
GFR, Estimated: >60 mL/min (ref >60)
Glucose, Bld: 71 mg/dL (ref 70–99)
Potassium: 4 mmol/L (ref 3.5–5.1)
Sodium: 141 mmol/L (ref 135–145)
Total Bilirubin: 0.6 mg/dL (ref 0.3–1.2)
Total Protein: 8.7 g/dL — ABNORMAL HIGH (ref 6.5–8.1)

## 2020-03-25 LAB — CBC WITH DIFFERENTIAL (CANCER CENTER ONLY)
Abs Immature Granulocytes: 0.04 10*3/uL (ref 0.00–0.07)
Basophils Absolute: 0.1 10*3/uL (ref 0.0–0.1)
Basophils Relative: 1 %
Eosinophils Absolute: 0.5 10*3/uL (ref 0.0–0.5)
Eosinophils Relative: 6 %
HCT: 44.7 % (ref 39.0–52.0)
Hemoglobin: 15.6 g/dL (ref 13.0–17.0)
Immature Granulocytes: 0 %
Lymphocytes Relative: 45 %
Lymphs Abs: 4.1 10*3/uL — ABNORMAL HIGH (ref 0.7–4.0)
MCH: 30.5 pg (ref 26.0–34.0)
MCHC: 34.9 g/dL (ref 30.0–36.0)
MCV: 87.3 fL (ref 80.0–100.0)
Monocytes Absolute: 0.9 10*3/uL (ref 0.1–1.0)
Monocytes Relative: 10 %
Neutro Abs: 3.3 10*3/uL (ref 1.7–7.7)
Neutrophils Relative %: 38 %
Platelet Count: 344 10*3/uL (ref 150–400)
RBC: 5.12 MIL/uL (ref 4.22–5.81)
RDW: 12.5 % (ref 11.5–15.5)
WBC Count: 8.9 10*3/uL (ref 4.0–10.5)
nRBC: 0 % (ref 0.0–0.2)

## 2020-03-25 LAB — TSH: TSH: 1.012 u[IU]/mL (ref 0.320–4.118)

## 2020-03-25 MED ORDER — FAMOTIDINE IN NACL 20-0.9 MG/50ML-% IV SOLN
20.0000 mg | Freq: Once | INTRAVENOUS | Status: AC
  Administered 2020-03-25: 20 mg via INTRAVENOUS

## 2020-03-25 MED ORDER — SODIUM CHLORIDE 0.9 % IV SOLN
1.0000 mg/kg | Freq: Once | INTRAVENOUS | Status: AC
  Administered 2020-03-25: 75 mg via INTRAVENOUS
  Filled 2020-03-25: qty 15

## 2020-03-25 MED ORDER — SODIUM CHLORIDE 0.9 % IV SOLN
3.2100 mg/kg | Freq: Once | INTRAVENOUS | Status: AC
  Administered 2020-03-25: 240 mg via INTRAVENOUS
  Filled 2020-03-25: qty 24

## 2020-03-25 MED ORDER — DIPHENHYDRAMINE HCL 50 MG/ML IJ SOLN
INTRAMUSCULAR | Status: AC
  Filled 2020-03-25: qty 1

## 2020-03-25 MED ORDER — DIPHENHYDRAMINE HCL 50 MG/ML IJ SOLN
25.0000 mg | Freq: Once | INTRAMUSCULAR | Status: AC
  Administered 2020-03-25: 25 mg via INTRAVENOUS

## 2020-03-25 MED ORDER — FAMOTIDINE IN NACL 20-0.9 MG/50ML-% IV SOLN
INTRAVENOUS | Status: AC
  Filled 2020-03-25: qty 50

## 2020-03-25 MED ORDER — SODIUM CHLORIDE 0.9 % IV SOLN
Freq: Once | INTRAVENOUS | Status: AC
  Filled 2020-03-25: qty 250

## 2020-03-25 NOTE — Patient Instructions (Signed)
[facility] Discharge Instructions for Patients Receiving Chemotherapy  Today you received the following chemotherapy agents: opdivo, yervoy  To help prevent nausea and vomiting after your treatment, we encourage you to take your nausea medication as directed.    If you develop nausea and vomiting that is not controlled by your nausea medication, call the clinic.   BELOW ARE SYMPTOMS THAT SHOULD BE REPORTED IMMEDIATELY:  *FEVER GREATER THAN 100.5 F  *CHILLS WITH OR WITHOUT FEVER  NAUSEA AND VOMITING THAT IS NOT CONTROLLED WITH YOUR NAUSEA MEDICATION  *UNUSUAL SHORTNESS OF BREATH  *UNUSUAL BRUISING OR BLEEDING  TENDERNESS IN MOUTH AND THROAT WITH OR WITHOUT PRESENCE OF ULCERS  *URINARY PROBLEMS  *BOWEL PROBLEMS  UNUSUAL RASH Items with * indicate a potential emergency and should be followed up as soon as possible.  Feel free to call the clinic should you have any questions or concerns. The clinic phone number is [phone].  Please show the [facility] at check-in to the Emergency Department and triage nurse.  Nivolumab injection What is this medicine? NIVOLUMAB (nye VOL ue mab) is a monoclonal antibody. It is used to treat colon cancer, esophageal cancer, head and neck cancer, Hodgkin lymphoma, kidney cancer, liver cancer, lung cancer, mesothelioma, melanoma, and urothelial cancer. This medicine may be used for other purposes; ask your health care provider or pharmacist if you have questions. COMMON BRAND NAME(S): Opdivo What should I tell my health care provider before I take this medicine? They need to know if you have any of these conditions:  diabetes  immune system problems  kidney disease  liver disease  lung disease  organ transplant  stomach or intestine problems  thyroid disease  an unusual or allergic reaction to nivolumab, other medicines, foods, dyes, or preservatives  pregnant or trying to get  pregnant  breast-feeding How should I use this medicine? This medicine is for infusion into a vein. It is given by a health care professional in a hospital or clinic setting. A special MedGuide will be given to you before each treatment. Be sure to read this information carefully each time. Talk to your pediatrician regarding the use of this medicine in children. While this drug may be prescribed for children as young as 12 years for selected conditions, precautions do apply. Overdosage: If you think you have taken too much of this medicine contact a poison control center or emergency room at once. NOTE: This medicine is only for you. Do not share this medicine with others. What if I miss a dose? It is important not to miss your dose. Call your doctor or health care professional if you are unable to keep an appointment. What may interact with this medicine? Interactions have not been studied. Give your health care provider a list of all the medicines, herbs, non-prescription drugs, or dietary supplements you use. Also tell them if you smoke, drink alcohol, or use illegal drugs. Some items may interact with your medicine. This list may not describe all possible interactions. Give your health care provider a list of all the medicines, herbs, non-prescription drugs, or dietary supplements you use. Also tell them if you smoke, drink alcohol, or use illegal drugs. Some items may interact with your medicine. What should I watch for while using this medicine? This drug may make you feel generally unwell. Continue your course of treatment even though you feel ill unless your doctor tells you to stop. You may need blood work done while you are taking  this medicine. Do not become pregnant while taking this medicine or for 5 months after stopping it. Women should inform their doctor if they wish to become pregnant or think they might be pregnant. There is a potential for serious side effects to an unborn  child. Talk to your health care professional or pharmacist for more information. Do not breast-feed an infant while taking this medicine or for 5 months after stopping it. What side effects may I notice from receiving this medicine? Side effects that you should report to your doctor or health care professional as soon as possible:  allergic reactions like skin rash, itching or hives, swelling of the face, lips, or tongue  breathing problems  blood in the urine  bloody or watery diarrhea or black, tarry stools  changes in emotions or moods  changes in vision  chest pain  cough  dizziness  feeling faint or lightheaded, falls  fever, chills  headache with fever, neck stiffness, confusion, loss of memory, sensitivity to light, hallucination, loss of contact with reality, or seizures  joint pain  mouth sores  redness, blistering, peeling or loosening of the skin, including inside the mouth  severe muscle pain or weakness  signs and symptoms of high blood sugar such as dizziness; dry mouth; dry skin; fruity breath; nausea; stomach pain; increased hunger or thirst; increased urination  signs and symptoms of kidney injury like trouble passing urine or change in the amount of urine  signs and symptoms of liver injury like dark yellow or brown urine; general ill feeling or flu-like symptoms; light-colored stools; loss of appetite; nausea; right upper belly pain; unusually weak or tired; yellowing of the eyes or skin  swelling of the ankles, feet, hands  trouble passing urine or change in the amount of urine  unusually weak or tired  weight gain or loss Side effects that usually do not require medical attention (report to your doctor or health care professional if they continue or are bothersome):  bone pain  constipation  decreased appetite  diarrhea  muscle pain  nausea, vomiting  tiredness This list may not describe all possible side effects. Call your doctor  for medical advice about side effects. You may report side effects to FDA at 1-800-FDA-1088. Where should I keep my medicine? This drug is given in a hospital or clinic and will not be stored at home. NOTE: This sheet is a summary. It may not cover all possible information. If you have questions about this medicine, talk to your doctor, pharmacist, or health care provider.  2020 Elsevier/Gold Standard (2018-12-09 10:04:50)  Ipilimumab injection What is this medicine? IPILIMUMAB (IP i LIM ue mab) is a monoclonal antibody. It is used to treat colorectal cancer, kidney cancer, liver cancer, lung cancer, melanoma, and mesothelioma. This medicine may be used for other purposes; ask your health care provider or pharmacist if you have questions. COMMON BRAND NAME(S): YERVOY What should I tell my health care provider before I take this medicine? They need to know if you have any of these conditions:  Addison's disease  blood in your stools (black or tarry stools) or if you have blood in your vomit  eye disease, vision problems  history of pancreatitis  history of stomach bleeding  immune system problems  inflammatory bowel disease  kidney disease  liver disease  lupus  myasthenia gravis  organ transplant  rheumatoid arthritis  sarcoidosis  stomach or intestine problems  thyroid disease  tingling of the fingers or toes, or  other nerve disorder  an unusual or allergic reaction to ipilimumab, other medicines, foods, dyes, or preservatives  pregnant or trying to get pregnant  breast-feeding How should I use this medicine? This medicine is for infusion into a vein. It is given by a health care professional in a hospital or clinic setting. A special MedGuide will be given to you before each treatment. Be sure to read this information carefully each time. Talk to your pediatrician regarding the use of this medicine in children. While this drug may be prescribed for children  as young as 12 years for selected conditions, precautions do apply. Overdosage: If you think you have taken too much of this medicine contact a poison control center or emergency room at once. NOTE: This medicine is only for you. Do not share this medicine with others. What if I miss a dose? It is important not to miss your dose. Call your doctor or health care professional if you are unable to keep an appointment. What may interact with this medicine? Interactions are not expected. This list may not describe all possible interactions. Give your health care provider a list of all the medicines, herbs, non-prescription drugs, or dietary supplements you use. Also tell them if you smoke, drink alcohol, or use illegal drugs. Some items may interact with your medicine. What should I watch for while using this medicine? Tell your doctor or healthcare professional if your symptoms do not start to get better or if they get worse. Do not become pregnant while taking this medicine or for 3 months after stopping it. Women should inform their doctor if they wish to become pregnant or think they might be pregnant. There is a potential for serious side effects to an unborn child. Talk to your health care professional or pharmacist for more information. Do not breast-feed an infant while taking this medicine or for 3 months after the last dose. Your condition will be monitored carefully while you are receiving this medicine. You may need blood work done while you are taking this medicine. What side effects may I notice from receiving this medicine? Side effects that you should report to your doctor or health care professional as soon as possible:  allergic reactions like skin rash, itching or hives, swelling of the face, lips, or tongue  black, tarry stools  bloody or watery diarrhea  changes in vision  dizziness  eye pain  fast, irregular heartbeat  feeling anxious  feeling faint or lightheaded,  falls  nausea, vomiting  pain, tingling, numbness in the hands or feet  redness, blistering, peeling or loosening of the skin, including inside the mouth  signs and symptoms of liver injury like dark yellow or brown urine; general ill feeling or flu-like symptoms; light-colored stools; loss of appetite; nausea; right upper belly pain; unusually weak or tired; yellowing of the eyes or skin  unusual bleeding or bruising Side effects that usually do not require medical attention (report to your doctor or health care professional if they continue or are bothersome):  headache  loss of appetite  trouble sleeping This list may not describe all possible side effects. Call your doctor for medical advice about side effects. You may report side effects to FDA at 1-800-FDA-1088. Where should I keep my medicine? This drug is given in a hospital or clinic and will not be stored at home. NOTE: This sheet is a summary. It may not cover all possible information. If you have questions about this medicine, talk to your  doctor, pharmacist, or health care provider.  2020 Elsevier/Gold Standard (2018-12-09 10:56:55)

## 2020-03-25 NOTE — Progress Notes (Signed)
Hematology and Oncology Follow Up Visit  Gordon Thomas [id number] [birth year] 40 y.o. 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 He presented with stage T3a localized tumor in April 2021. Marland Kitchen   Prior Therapy: He is status post right radical nephrectomy completed on June 12, 2019.  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   Current therapy: Ipilimumab 1 mg/kg and nivolumab 3 mg/kg given every 3 weeks. Cycle 1 given on March 01, 2020. He is here for cycle 2 of therapy.  Interim History: Gordon Thomas presents today for repeat evaluation. Since the last visit, he tolerated the first cycle of therapy without any complaints. He denies any nausea, vomiting or abdominal pain. He denies any diarrhea or respiratory complaints. He denies any excessive fatigue. He did report some mild pruritus which is manageable. Continues to attempt activities of daily living.     Medications: Unchanged on review. Current Outpatient Medications  Medication Sig Dispense Refill  . albuterol (VENTOLIN HFA) 108 (90 Base) MCG/ACT inhaler Inhale 1-2 puffs into the lungs every 6 (six) hours as needed for wheezing or shortness of breath.    . docusate sodium (COLACE) 100 MG capsule Take 1 capsule (100 mg total) by mouth 2 (two) times daily.    Marland Kitchen HYDROcodone-acetaminophen (NORCO) 5-325 MG tablet Take 1-2 tablets by mouth every 6 (six) hours as needed. 30 tablet 0  . prochlorperazine (COMPAZINE) 10 MG tablet Take 1 tablet (10 mg total) by mouth every 6 (six) hours as needed for nausea or vomiting. 30 tablet 0  . promethazine (PHENERGAN) 12.5 MG tablet Take 1 tablet (12.5 mg total) by mouth every 4 (four) hours as needed for nausea or vomiting. 15 tablet 0   No current facility-administered medications for this visit.      Allergies: No Known Allergies    Physical Exam: Blood pressure (!) 139/97, pulse 97, temperature 97.8 F (36.6 C), temperature source Tympanic, resp. rate 18, height 5\' 5"  (1.651 m), weight 166 lb 11.2 oz (75.6 kg), SpO2 98 %.   ECOG:  0   General appearance: Comfortable appearing without any discomfort Head: Normocephalic without any trauma Oropharynx: Mucous membranes are moist and pink without any thrush or ulcers. Eyes: Pupils are equal and round reactive to light. Lymph nodes: No cervical, supraclavicular, inguinal or axillary lymphadenopathy.   Heart:regular rate and rhythm.  S1 and S2 without leg edema. Lung: Clear without any rhonchi or wheezes.  No dullness to percussion. Abdomin: Soft, nontender, nondistended with good bowel sounds.  No hepatosplenomegaly. Musculoskeletal: No joint deformity or effusion.  Full range of motion noted. Neurological: No deficits noted on motor, sensory and deep tendon reflex exam. Skin: No petechial rash or dryness.  Appeared moist.         Lab Results: Lab Results  Component Value Date   WBC 8.9 03/25/2020   HGB 15.6 03/25/2020   HCT 44.7 03/25/2020   MCV 87.3 03/25/2020   PLT 344 03/25/2020     Chemistry      Component Value Date/Time   NA 138 03/01/2020 1227   K 3.9 03/01/2020 1227   CL 105 03/01/2020 1227   CO2 27 03/01/2020 1227   BUN 15 03/01/2020 1227   CREATININE 1.39 (H) 03/01/2020 1227      Component Value Date/Time   CALCIUM 9.5 03/01/2020 1227   ALKPHOS  90 03/01/2020 1227   AST 30 03/01/2020 1227   ALT 40 03/01/2020 1227   BILITOT 0.7 03/01/2020 1227       IMPRESSION: 1. Multiple new and enlarged pulmonary nodules throughout the lungs. 2. Multiple new heterogeneously enhancing pleural nodules about the right lung base. 3. There is a heterogeneously enhancing soft tissue nodule in the vicinity of the right adrenal gland, which enlarged, now discrete and measurable. 4. Findings are consistent  with locally recurrent and metastatic renal cell carcinoma following right nephrectomy.    Impression and Plan:   40 year old with:  1.   Stage IV clear-cell renal cell carcinoma with pulmonary nodules documented since September 2021. Imaging studies in December 2021 showed worsening progression.    He is currently receiving ipilimumab and nivolumab therapy and completed the first cycle without any complications. Risks and benefits of proceeding with cycle 2 were discussed. Complications that include nausea, fatigue and immune mediated complications were reiterated. The plan is to complete 4 cycles of therapy and subsequently update his staging scans. Pending these results maintenance nivolumab will be initiated. Different salvage options such as oral targeted therapy would be used instead.   2.  Immune mediated complications: Complications including thyroid disease, pneumonitis, colitis, hepatitis and hypophysitis were reiterated. He is not experiencing any at this time.  3.  IV access: Peripheral veins continues to be in use without any issues.  4.  Antiemetics: No nausea or vomiting reported at this time. Compazine is available to him.  5.  Goals of care and prognosis discussion: Therapy remains aggressive at this time although the likelihood of cure remains low. Given his age and excellent performance status we will continue with aggressive measures.  6.  Follow-up: In 3 weeks for the next cycle of therapy.  30  minutes were spent on this visit. The time was dedicated to reviewing his disease status, discussing complications related to therapy and future plan of care discussion.   Gordon Button, MD 1/21/202211:34 AM

## 2020-03-29 ENCOUNTER — Telehealth: Payer: Self-pay | Admitting: Oncology

## 2020-03-29 NOTE — Telephone Encounter (Signed)
Scheduled per 01/21 los, patient has been called and notified.

## 2020-04-12 ENCOUNTER — Inpatient Hospital Stay: Payer: Managed Care, Other (non HMO) | Attending: Oncology

## 2020-04-12 ENCOUNTER — Other Ambulatory Visit: Payer: Self-pay

## 2020-04-12 ENCOUNTER — Inpatient Hospital Stay: Payer: Managed Care, Other (non HMO)

## 2020-04-12 ENCOUNTER — Inpatient Hospital Stay (HOSPITAL_BASED_OUTPATIENT_CLINIC_OR_DEPARTMENT_OTHER): Payer: Managed Care, Other (non HMO) | Admitting: Oncology

## 2020-04-12 VITALS — BP 144/87 | HR 91 | Temp 98.1°F | Resp 18 | Ht 65.0 in | Wt 167.5 lb

## 2020-04-12 DIAGNOSIS — C649 Malignant neoplasm of unspecified kidney, except renal pelvis: Secondary | ICD-10-CM | POA: Diagnosis present

## 2020-04-12 DIAGNOSIS — Z79899 Other long term (current) drug therapy: Secondary | ICD-10-CM | POA: Diagnosis not present

## 2020-04-12 DIAGNOSIS — Z5112 Encounter for antineoplastic immunotherapy: Secondary | ICD-10-CM | POA: Diagnosis not present

## 2020-04-12 DIAGNOSIS — C641 Malignant neoplasm of right kidney, except renal pelvis: Secondary | ICD-10-CM | POA: Diagnosis not present

## 2020-04-12 DIAGNOSIS — C7800 Secondary malignant neoplasm of unspecified lung: Secondary | ICD-10-CM | POA: Insufficient documentation

## 2020-04-12 DIAGNOSIS — E079 Disorder of thyroid, unspecified: Secondary | ICD-10-CM | POA: Diagnosis not present

## 2020-04-12 LAB — CMP (CANCER CENTER ONLY)
ALT: 27 U/L (ref 0–44)
AST: 27 U/L (ref 15–41)
Albumin: 4.3 g/dL (ref 3.5–5.0)
Alkaline Phosphatase: 88 U/L (ref 38–126)
Anion gap: 8 (ref 5–15)
BUN: 18 mg/dL (ref 6–20)
CO2: 26 mmol/L (ref 22–32)
Calcium: 9.5 mg/dL (ref 8.9–10.3)
Chloride: 102 mmol/L (ref 98–111)
Creatinine: 1.36 mg/dL — ABNORMAL HIGH (ref 0.61–1.24)
GFR, Estimated: >60 mL/min (ref >60)
Glucose, Bld: 84 mg/dL (ref 70–99)
Potassium: 3.8 mmol/L (ref 3.5–5.1)
Sodium: 136 mmol/L (ref 135–145)
Total Bilirubin: 0.6 mg/dL (ref 0.3–1.2)
Total Protein: 8.4 g/dL — ABNORMAL HIGH (ref 6.5–8.1)

## 2020-04-12 LAB — CBC WITH DIFFERENTIAL (CANCER CENTER ONLY)
Abs Immature Granulocytes: 0.02 10*3/uL (ref 0.00–0.07)
Basophils Absolute: 0 10*3/uL (ref 0.0–0.1)
Basophils Relative: 1 %
Eosinophils Absolute: 0.3 10*3/uL (ref 0.0–0.5)
Eosinophils Relative: 3 %
HCT: 43.4 % (ref 39.0–52.0)
Hemoglobin: 15.1 g/dL (ref 13.0–17.0)
Immature Granulocytes: 0 %
Lymphocytes Relative: 44 %
Lymphs Abs: 3.7 10*3/uL (ref 0.7–4.0)
MCH: 30.4 pg (ref 26.0–34.0)
MCHC: 34.8 g/dL (ref 30.0–36.0)
MCV: 87.3 fL (ref 80.0–100.0)
Monocytes Absolute: 0.8 10*3/uL (ref 0.1–1.0)
Monocytes Relative: 9 %
Neutro Abs: 3.7 10*3/uL (ref 1.7–7.7)
Neutrophils Relative %: 43 %
Platelet Count: 323 10*3/uL (ref 150–400)
RBC: 4.97 MIL/uL (ref 4.22–5.81)
RDW: 12.7 % (ref 11.5–15.5)
WBC Count: 8.6 10*3/uL (ref 4.0–10.5)
nRBC: 0 % (ref 0.0–0.2)

## 2020-04-12 LAB — TSH: TSH: 0.661 u[IU]/mL (ref 0.320–4.118)

## 2020-04-12 MED ORDER — FAMOTIDINE IN NACL 20-0.9 MG/50ML-% IV SOLN
20.0000 mg | Freq: Once | INTRAVENOUS | Status: AC
  Administered 2020-04-12: 20 mg via INTRAVENOUS

## 2020-04-12 MED ORDER — DIPHENHYDRAMINE HCL 50 MG/ML IJ SOLN
INTRAMUSCULAR | Status: AC
  Filled 2020-04-12: qty 1

## 2020-04-12 MED ORDER — FAMOTIDINE IN NACL 20-0.9 MG/50ML-% IV SOLN
INTRAVENOUS | Status: AC
  Filled 2020-04-12: qty 50

## 2020-04-12 MED ORDER — DIPHENHYDRAMINE HCL 50 MG/ML IJ SOLN
25.0000 mg | Freq: Once | INTRAMUSCULAR | Status: AC
  Administered 2020-04-12: 25 mg via INTRAVENOUS

## 2020-04-12 MED ORDER — SODIUM CHLORIDE 0.9 % IV SOLN
3.2100 mg/kg | Freq: Once | INTRAVENOUS | Status: AC
  Administered 2020-04-12: 240 mg via INTRAVENOUS
  Filled 2020-04-12: qty 24

## 2020-04-12 MED ORDER — SODIUM CHLORIDE 0.9 % IV SOLN
Freq: Once | INTRAVENOUS | Status: AC
  Filled 2020-04-12: qty 250

## 2020-04-12 MED ORDER — SODIUM CHLORIDE 0.9 % IV SOLN
1.0000 mg/kg | Freq: Once | INTRAVENOUS | Status: AC
  Administered 2020-04-12: 75 mg via INTRAVENOUS
  Filled 2020-04-12: qty 15

## 2020-04-12 NOTE — Patient Instructions (Signed)
[facility] Discharge Instructions for Patients Receiving Chemotherapy  Today you received the following chemotherapy agents: opdivo, yervoy  To help prevent nausea and vomiting after your treatment, we encourage you to take your nausea medication as directed.    If you develop nausea and vomiting that is not controlled by your nausea medication, call the clinic.   BELOW ARE SYMPTOMS THAT SHOULD BE REPORTED IMMEDIATELY:  *FEVER GREATER THAN 100.5 F  *CHILLS WITH OR WITHOUT FEVER  NAUSEA AND VOMITING THAT IS NOT CONTROLLED WITH YOUR NAUSEA MEDICATION  *UNUSUAL SHORTNESS OF BREATH  *UNUSUAL BRUISING OR BLEEDING  TENDERNESS IN MOUTH AND THROAT WITH OR WITHOUT PRESENCE OF ULCERS  *URINARY PROBLEMS  *BOWEL PROBLEMS  UNUSUAL RASH Items with * indicate a potential emergency and should be followed up as soon as possible.  Feel free to call the clinic should you have any questions or concerns. The clinic phone number is [phone].  Please show the [facility] at check-in to the Emergency Department and triage nurse.

## 2020-04-12 NOTE — Progress Notes (Signed)
Hematology and Oncology Follow Up Visit  Stillman Buenger [id number] Dec 23, 1980 40 y.o. 04/12/2020 11:15 AM [name], MDShelton, [name], MD   Principle Diagnosis: 40 year old man with kidney cancer diagnosed in April 2021.  He developed stage IV clear-cell disease with pulmonary involvement noted in September 2021.    Prior Therapy: He is status post right radical nephrectomy completed on June 12, 2019.  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   Current therapy: Ipilimumab 1 mg/kg and nivolumab 3 mg/kg given every 3 weeks. Cycle 1 given on March 01, 2020. He is here for cycle 3 of therapy.  Interim History: Mr. Derksen is here for a follow-up visit.  Since the last visit, he reports no major changes in his health.  He has tolerated treatment without any major complaints.  He denies any nausea, vomiting or abdominal pain.  He denies any diarrhea, shortness of breath or difficulty breathing his performance status quality of life remain excellent.  He continues to work full-time and exercise periodically.     Medications: Updated on review. Current Outpatient Medications  Medication Sig Dispense Refill  . albuterol (VENTOLIN HFA) 108 (90 Base) MCG/ACT inhaler Inhale 1-2 puffs into the lungs every 6 (six) hours as needed for wheezing or shortness of breath.    . docusate sodium (COLACE) 100 MG capsule Take 1 capsule (100 mg total) by mouth 2 (two) times daily.    Marland Kitchen HYDROcodone-acetaminophen (NORCO) 5-325 MG tablet Take 1-2 tablets by mouth every 6 (six) hours as needed. 30 tablet 0  . prochlorperazine (COMPAZINE) 10 MG tablet Take 1 tablet (10 mg total) by mouth every 6 (six) hours as needed for nausea or vomiting. 30 tablet 0  . promethazine (PHENERGAN) 12.5 MG tablet Take 1 tablet (12.5 mg total) by mouth every 4 (four) hours as needed for nausea or vomiting. 15 tablet 0   No current facility-administered medications for this visit.      Allergies: No Known Allergies    Physical Exam: Blood pressure (!) 144/87, pulse 91, temperature 98.1 F (36.7 C), temperature source Tympanic, resp. rate 18, height 5\' 5"  (1.651 m), weight 167 lb 8 oz (76 kg), SpO2 99 %.    ECOG:  0     General appearance: Alert, awake without any distress. Head: Atraumatic without abnormalities Oropharynx: Without any thrush or ulcers. Eyes: No scleral icterus. Lymph nodes: No lymphadenopathy noted in the cervical, supraclavicular, or axillary nodes Heart:regular rate and rhythm, without any murmurs or gallops.   Lung: Clear to auscultation without any rhonchi, wheezes or dullness to percussion. Abdomin: Soft, nontender without any shifting dullness or ascites. Musculoskeletal: No clubbing or cyanosis. Neurological: No motor or sensory deficits. Skin: No rashes or lesions.         Lab Results: Lab Results  Component Value Date   WBC 8.9 03/25/2020   HGB 15.6 03/25/2020   HCT 44.7 03/25/2020   MCV 87.3 03/25/2020   PLT 344 03/25/2020     Chemistry      Component Value Date/Time   NA 141 03/25/2020 1101   K 4.0 03/25/2020 1101   CL 105 03/25/2020 1101   CO2 25 03/25/2020 1101   BUN 17 03/25/2020 1101   CREATININE 1.37 (H) 03/25/2020 1101      Component Value Date/Time   CALCIUM 9.2 03/25/2020 1101   ALKPHOS 89 03/25/2020 1101   AST 25 03/25/2020 1101   ALT 29 03/25/2020 1101   BILITOT 0.6 03/25/2020  1101          Impression and Plan:   40 year old with:  1.   Kidney cancer diagnosed in April of 2021.  He developed stage IV clear-cell renal cell carcinoma with pulmonary nodules.   The natural course of his disease was reviewed today and treatment options were reiterated.  He is currently receiving therapy with ipilimumab and nivolumab and here for cycle 3.  Risks and benefits of proceeding with the current therapy were discussed at this time.  Potential complications that include immune mediated  complications, GI toxicity among others were reviewed.  Alternative treatment options including oral targeted therapy in combination with immunotherapy.  The plan is to proceed with cycle 3 and cycle 4 and update his staging scans at that time.  He is agreeable to proceed at this time.  2.  Immune mediated complications: I continue to educate him about potential complications.  These include pneumonitis, colitis, hepatitis and thyroid disease.  He is not experiencing any at this time.  3.  IV access: No issues reported with his Port-A-Cath at this time.  4.  Antiemetics: Compazine is available to him without any nausea or vomiting.  5.  Goals of care and prognosis discussion: His disease is incurable although aggressive measures are warranted given his excellent performance status and age.  6.  Follow-up: In 3 weeks for the next cycle of therapy.  30  minutes were spent on this visit. The time was dedicated to reviewing his disease status, discussing complications related to therapy and future plan of care discussion.   Zola Button, MD 2/8/202211:15 AM

## 2020-04-12 NOTE — Progress Notes (Signed)
Yervoy questionnaire complete.

## 2020-04-27 ENCOUNTER — Telehealth: Payer: Self-pay | Admitting: Oncology

## 2020-04-27 NOTE — Telephone Encounter (Signed)
Called patient regarding upcoming March appointments, patient is notified. 

## 2020-05-04 ENCOUNTER — Other Ambulatory Visit: Payer: Managed Care, Other (non HMO)

## 2020-05-04 ENCOUNTER — Other Ambulatory Visit: Payer: Self-pay

## 2020-05-04 ENCOUNTER — Inpatient Hospital Stay: Payer: Managed Care, Other (non HMO)

## 2020-05-04 ENCOUNTER — Ambulatory Visit: Payer: Managed Care, Other (non HMO) | Admitting: Oncology

## 2020-05-04 ENCOUNTER — Inpatient Hospital Stay (HOSPITAL_BASED_OUTPATIENT_CLINIC_OR_DEPARTMENT_OTHER): Payer: Managed Care, Other (non HMO) | Admitting: Oncology

## 2020-05-04 ENCOUNTER — Ambulatory Visit: Payer: Managed Care, Other (non HMO)

## 2020-05-04 ENCOUNTER — Inpatient Hospital Stay: Payer: Managed Care, Other (non HMO) | Attending: Oncology

## 2020-05-04 VITALS — BP 135/82 | HR 89 | Temp 97.7°F | Resp 13 | Ht 65.0 in | Wt 166.0 lb

## 2020-05-04 DIAGNOSIS — Z79899 Other long term (current) drug therapy: Secondary | ICD-10-CM | POA: Insufficient documentation

## 2020-05-04 DIAGNOSIS — C641 Malignant neoplasm of right kidney, except renal pelvis: Secondary | ICD-10-CM

## 2020-05-04 DIAGNOSIS — C649 Malignant neoplasm of unspecified kidney, except renal pelvis: Secondary | ICD-10-CM | POA: Insufficient documentation

## 2020-05-04 DIAGNOSIS — C7800 Secondary malignant neoplasm of unspecified lung: Secondary | ICD-10-CM | POA: Diagnosis present

## 2020-05-04 DIAGNOSIS — Z5112 Encounter for antineoplastic immunotherapy: Secondary | ICD-10-CM | POA: Insufficient documentation

## 2020-05-04 LAB — CBC WITH DIFFERENTIAL (CANCER CENTER ONLY)
Abs Immature Granulocytes: 0.02 10*3/uL (ref 0.00–0.07)
Basophils Absolute: 0.1 10*3/uL (ref 0.0–0.1)
Basophils Relative: 1 %
Eosinophils Absolute: 0.4 10*3/uL (ref 0.0–0.5)
Eosinophils Relative: 5 %
HCT: 45.9 % (ref 39.0–52.0)
Hemoglobin: 16.3 g/dL (ref 13.0–17.0)
Immature Granulocytes: 0 %
Lymphocytes Relative: 45 %
Lymphs Abs: 4 10*3/uL (ref 0.7–4.0)
MCH: 30.5 pg (ref 26.0–34.0)
MCHC: 35.5 g/dL (ref 30.0–36.0)
MCV: 85.8 fL (ref 80.0–100.0)
Monocytes Absolute: 0.8 10*3/uL (ref 0.1–1.0)
Monocytes Relative: 9 %
Neutro Abs: 3.6 10*3/uL (ref 1.7–7.7)
Neutrophils Relative %: 40 %
Platelet Count: 326 10*3/uL (ref 150–400)
RBC: 5.35 MIL/uL (ref 4.22–5.81)
RDW: 12.3 % (ref 11.5–15.5)
WBC Count: 8.9 10*3/uL (ref 4.0–10.5)
nRBC: 0 % (ref 0.0–0.2)

## 2020-05-04 LAB — CMP (CANCER CENTER ONLY)
ALT: 37 U/L (ref 0–44)
AST: 30 U/L (ref 15–41)
Albumin: 4.6 g/dL (ref 3.5–5.0)
Alkaline Phosphatase: 103 U/L (ref 38–126)
Anion gap: 10 (ref 5–15)
BUN: 18 mg/dL (ref 6–20)
CO2: 25 mmol/L (ref 22–32)
Calcium: 9.6 mg/dL (ref 8.9–10.3)
Chloride: 105 mmol/L (ref 98–111)
Creatinine: 1.55 mg/dL — ABNORMAL HIGH (ref 0.61–1.24)
GFR, Estimated: 59 mL/min — ABNORMAL LOW (ref >60)
Glucose, Bld: 90 mg/dL (ref 70–99)
Potassium: 3.7 mmol/L (ref 3.5–5.1)
Sodium: 140 mmol/L (ref 135–145)
Total Bilirubin: 0.5 mg/dL (ref 0.3–1.2)
Total Protein: 9 g/dL — ABNORMAL HIGH (ref 6.5–8.1)

## 2020-05-04 LAB — TSH: TSH: 0.503 u[IU]/mL (ref 0.320–4.118)

## 2020-05-04 MED ORDER — DIPHENHYDRAMINE HCL 50 MG/ML IJ SOLN
INTRAMUSCULAR | Status: AC
  Filled 2020-05-04: qty 1

## 2020-05-04 MED ORDER — SODIUM CHLORIDE 0.9 % IV SOLN
Freq: Once | INTRAVENOUS | Status: AC
  Filled 2020-05-04: qty 250

## 2020-05-04 MED ORDER — FAMOTIDINE IN NACL 20-0.9 MG/50ML-% IV SOLN
20.0000 mg | Freq: Once | INTRAVENOUS | Status: AC
  Administered 2020-05-04: 20 mg via INTRAVENOUS

## 2020-05-04 MED ORDER — IPILIMUMAB CHEMO INJECTION 200 MG/40ML
1.0000 mg/kg | Freq: Once | INTRAVENOUS | Status: AC
Start: 2020-05-04 — End: 2020-05-04
  Administered 2020-05-04: 75 mg via INTRAVENOUS
  Filled 2020-05-04: qty 15

## 2020-05-04 MED ORDER — SODIUM CHLORIDE 0.9 % IV SOLN
3.2100 mg/kg | Freq: Once | INTRAVENOUS | Status: AC
  Administered 2020-05-04: 240 mg via INTRAVENOUS
  Filled 2020-05-04: qty 24

## 2020-05-04 MED ORDER — DIPHENHYDRAMINE HCL 50 MG/ML IJ SOLN
25.0000 mg | Freq: Once | INTRAMUSCULAR | Status: AC
  Administered 2020-05-04: 25 mg via INTRAVENOUS

## 2020-05-04 MED ORDER — FAMOTIDINE IN NACL 20-0.9 MG/50ML-% IV SOLN
INTRAVENOUS | Status: AC
  Filled 2020-05-04: qty 50

## 2020-05-04 NOTE — Progress Notes (Signed)
Ok to treat patient with Cr of 1.55 per Dr. Zola Button.

## 2020-05-04 NOTE — Progress Notes (Signed)
Hematology and Oncology Follow Up Visit  Gordon Thomas [id number] Mar 22, 1980 40 y.o. 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   Prior Therapy: He is status post right radical nephrectomy completed on June 12, 2019.  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   Current therapy: Ipilimumab 1 mg/kg and nivolumab 3 mg/kg given every 3 weeks. Cycle 1 given on March 01, 2020. He is here for cycle 4 of therapy.  Interim History: Gordon Thomas returns today for repeat evaluation.  Since the last visit, he reports no major changes in his health.  He has tolerated therapy without any complaints at this time.  He denies any nausea or vomiting or fatigue.  He denies any worsening diarrhea.  Denies shortness of breath, cough or wheezing.  His performance status quality of life remained excellent.     Medications: Unchanged on review. Current Outpatient Medications  Medication Sig Dispense Refill  . albuterol (VENTOLIN HFA) 108 (90 Base) MCG/ACT inhaler Inhale 1-2 puffs into the lungs every 6 (six) hours as needed for wheezing or shortness of breath.    . docusate sodium (COLACE) 100 MG capsule Take 1 capsule (100 mg total) by mouth 2 (two) times daily.    Marland Kitchen HYDROcodone-acetaminophen (NORCO) 5-325 MG tablet Take 1-2 tablets by mouth every 6 (six) hours as needed. 30 tablet 0  . prochlorperazine (COMPAZINE) 10 MG tablet Take 1 tablet (10 mg total) by mouth every 6 (six) hours as needed for nausea or vomiting. 30 tablet 0  . promethazine (PHENERGAN) 12.5 MG tablet Take 1 tablet (12.5 mg total) by mouth every 4 (four) hours as needed for nausea or vomiting. 15 tablet 0   No current facility-administered medications for this visit.     Allergies: No Known Allergies    Physical Exam:  Blood  pressure 135/82, pulse 89, temperature 97.7 F (36.5 C), temperature source Tympanic, resp. rate 13, height 5\' 5"  (1.651 m), weight 166 lb (75.3 kg), SpO2 100 %.    ECOG:  0    General appearance: Comfortable appearing without any discomfort Head: Normocephalic without any trauma Oropharynx: Mucous membranes are moist and pink without any thrush or ulcers. Eyes: Pupils are equal and round reactive to light. Lymph nodes: No cervical, supraclavicular, inguinal or axillary lymphadenopathy.   Heart:regular rate and rhythm.  S1 and S2 without leg edema. Lung: Clear without any rhonchi or wheezes.  No dullness to percussion. Abdomin: Soft, nontender, nondistended with good bowel sounds.  No hepatosplenomegaly. Musculoskeletal: No joint deformity or effusion.  Full range of motion noted. Neurological: No deficits noted on motor, sensory and deep tendon reflex exam. Skin: No petechial rash or dryness.  Appeared moist.          Lab Results: Lab Results  Component Value Date   WBC 8.6 04/12/2020   HGB 15.1 04/12/2020   HCT 43.4 04/12/2020   MCV 87.3 04/12/2020   PLT 323 04/12/2020     Chemistry      Component Value Date/Time   NA 136 04/12/2020 1120   K 3.8 04/12/2020 1120   CL 102 04/12/2020 1120   CO2 26 04/12/2020 1120   BUN 18 04/12/2020 1120   CREATININE 1.36 (H) 04/12/2020 1120      Component Value Date/Time   CALCIUM 9.5 04/12/2020 1120   ALKPHOS 88 04/12/2020 1120  AST 27 04/12/2020 1120   ALT 27 04/12/2020 1120   BILITOT 0.6 04/12/2020 1120          Impression and Plan:   40 year old with:  1.   Stage IV clear-cell renal cell carcinoma with pulmonary involvement documented in September 2021.  He continues to tolerate current therapy with ipilimumab and nivolumab without any major complications.  Risks and benefits of proceeding with cycle 4 of therapy were discussed.  Potential complications includes immune mediated issues as well as GI toxicities  among others.  We will obtain a CT scan imaging before potentially starting maintenance nivolumab.  Alternative options including oral targeted therapy with immunotherapy as a potential salvage.  He is agreeable to proceed at this time.  2.  Immune mediated complications: Complications such as pneumonitis, colitis, thyroid disease as well as hypophysitis among many others were reviewed.  He is not experiencing any at this time.  3.  IV access: Peripheral veins are currently in use without any need for a Port-A-Cath.  4.  Antiemetics: No nausea reported and Compazine is available to him.  5.  Goals of care and prognosis discussion: Aggressive measures are warranted given his excellent performance status but his disease is likely incurable.  6.  Follow-up: In 3 weeks for repeat evaluation.  30  minutes were spent on this encounter.  The time was dedicated to reviewing disease status, treatment options and outlining future care.   Zola Button, MD 3/2/202212:17 PM

## 2020-05-24 ENCOUNTER — Ambulatory Visit (HOSPITAL_COMMUNITY): Payer: Managed Care, Other (non HMO)

## 2020-05-25 ENCOUNTER — Telehealth: Payer: Self-pay | Admitting: *Deleted

## 2020-05-25 NOTE — Telephone Encounter (Signed)
See note below

## 2020-05-25 NOTE — Telephone Encounter (Signed)
-----   Message from Wyatt Portela, MD sent at 05/25/2020  8:29 AM EDT ----- No it will not. Insurance has been approved it as ordered. Thanks ----- Message ----- From: Rolene Course, RN Sent: 05/25/2020   8:25 AM EDT To: Wyatt Portela, MD  Tedra Coupe from Radiology called & said CT abdomen/pelvis order needs to be changed to with/without contrast. Thanks, Bethena Roys

## 2020-05-26 ENCOUNTER — Other Ambulatory Visit: Payer: Self-pay

## 2020-05-26 ENCOUNTER — Ambulatory Visit (HOSPITAL_COMMUNITY)
Admission: RE | Admit: 2020-05-26 | Discharge: 2020-05-26 | Disposition: A | Discharge status: Home / Self Care | Payer: Managed Care, Other (non HMO) | Source: Ambulatory Visit | Attending: Oncology | Admitting: Oncology

## 2020-05-26 ENCOUNTER — Encounter (HOSPITAL_COMMUNITY): Payer: Self-pay

## 2020-05-26 DIAGNOSIS — C641 Malignant neoplasm of right kidney, except renal pelvis: Secondary | ICD-10-CM | POA: Insufficient documentation

## 2020-05-26 MED ORDER — IOHEXOL 300 MG/ML  SOLN
100.0000 mL | Freq: Once | INTRAMUSCULAR | Status: AC | PRN
  Administered 2020-05-26: 100 mL via INTRAVENOUS

## 2020-06-01 ENCOUNTER — Telehealth: Payer: Self-pay

## 2020-06-01 ENCOUNTER — Inpatient Hospital Stay: Payer: Managed Care, Other (non HMO)

## 2020-06-01 ENCOUNTER — Other Ambulatory Visit: Payer: Self-pay

## 2020-06-01 ENCOUNTER — Other Ambulatory Visit: Payer: Self-pay | Admitting: Oncology

## 2020-06-01 ENCOUNTER — Telehealth: Payer: Self-pay | Admitting: Pharmacist

## 2020-06-01 ENCOUNTER — Inpatient Hospital Stay (HOSPITAL_BASED_OUTPATIENT_CLINIC_OR_DEPARTMENT_OTHER): Payer: Managed Care, Other (non HMO) | Admitting: Oncology

## 2020-06-01 VITALS — BP 125/87 | HR 91 | Temp 97.6°F | Resp 16 | Ht 65.0 in | Wt 166.9 lb

## 2020-06-01 DIAGNOSIS — C641 Malignant neoplasm of right kidney, except renal pelvis: Secondary | ICD-10-CM

## 2020-06-01 DIAGNOSIS — Z5112 Encounter for antineoplastic immunotherapy: Secondary | ICD-10-CM | POA: Diagnosis not present

## 2020-06-01 LAB — CMP (CANCER CENTER ONLY)
ALT: 33 U/L (ref 0–44)
AST: 27 U/L (ref 15–41)
Albumin: 4.3 g/dL (ref 3.5–5.0)
Alkaline Phosphatase: 91 U/L (ref 38–126)
Anion gap: 10 (ref 5–15)
BUN: 15 mg/dL (ref 6–20)
CO2: 25 mmol/L (ref 22–32)
Calcium: 9 mg/dL (ref 8.9–10.3)
Chloride: 104 mmol/L (ref 98–111)
Creatinine: 1.36 mg/dL — ABNORMAL HIGH (ref 0.61–1.24)
GFR, Estimated: >60 mL/min (ref >60)
Glucose, Bld: 93 mg/dL (ref 70–99)
Potassium: 4.1 mmol/L (ref 3.5–5.1)
Sodium: 139 mmol/L (ref 135–145)
Total Bilirubin: 0.7 mg/dL (ref 0.3–1.2)
Total Protein: 8.8 g/dL — ABNORMAL HIGH (ref 6.5–8.1)

## 2020-06-01 LAB — TSH: TSH: 1.068 u[IU]/mL (ref 0.320–4.118)

## 2020-06-01 LAB — CBC WITH DIFFERENTIAL (CANCER CENTER ONLY)
Abs Immature Granulocytes: 0.04 10*3/uL (ref 0.00–0.07)
Basophils Absolute: 0.1 10*3/uL (ref 0.0–0.1)
Basophils Relative: 1 %
Eosinophils Absolute: 0.5 10*3/uL (ref 0.0–0.5)
Eosinophils Relative: 5 %
HCT: 43.8 % (ref 39.0–52.0)
Hemoglobin: 15.9 g/dL (ref 13.0–17.0)
Immature Granulocytes: 0 %
Lymphocytes Relative: 38 %
Lymphs Abs: 3.6 10*3/uL (ref 0.7–4.0)
MCH: 31.1 pg (ref 26.0–34.0)
MCHC: 36.3 g/dL — ABNORMAL HIGH (ref 30.0–36.0)
MCV: 85.7 fL (ref 80.0–100.0)
Monocytes Absolute: 0.8 10*3/uL (ref 0.1–1.0)
Monocytes Relative: 9 %
Neutro Abs: 4.4 10*3/uL (ref 1.7–7.7)
Neutrophils Relative %: 47 %
Platelet Count: 320 10*3/uL (ref 150–400)
RBC: 5.11 MIL/uL (ref 4.22–5.81)
RDW: 12.4 % (ref 11.5–15.5)
WBC Count: 9.3 10*3/uL (ref 4.0–10.5)
nRBC: 0 % (ref 0.0–0.2)

## 2020-06-01 MED ORDER — CABOMETYX 40 MG PO TABS: 40.0000 mg | ORAL_TABLET | Freq: Every day | ORAL | 0 refills | Status: DC

## 2020-06-01 MED ORDER — SODIUM CHLORIDE 0.9 % IV SOLN
Freq: Once | INTRAVENOUS | Status: AC
  Filled 2020-06-01: qty 250

## 2020-06-01 MED ORDER — SODIUM CHLORIDE 0.9 % IV SOLN
480.0000 mg | Freq: Once | INTRAVENOUS | Status: AC
  Administered 2020-06-01: 480 mg via INTRAVENOUS
  Filled 2020-06-01: qty 48

## 2020-06-01 NOTE — Telephone Encounter (Addendum)
Oral Oncology Pharmacist Encounter  Received new prescription for Cabometyx (cabozantinib) for the treatment of stage IV clear-cell renal cell carcinoma in conjunction with nivolumab, planned duration until disease progression or unacceptable drug toxicity.  Prescription dose and frequency assessed for appropriateness. Appropriate for therapy initiation.   CBC w/ Diff and CMP from 06/01/20 assessed, noted Scr 1.36 (CrCl ~79.6 mL/min) - no renal dose adjustments required. BP stable (125/87 mmHg). TSH from 05/04/20 WNL.  Current medication list in Epic reviewed, no relevant/significant DDIs with Cabometyx identified.  Evaluated chart and no patient barriers to medication adherence noted.   Patient agreement for Cabometyx treatment documented in MD note on 06/01/20.  Notified patient's insurance requires Cabometyx to be filled through [facility]. Prescription redirected for dispensing.   Oral Oncology Clinic will continue to follow for insurance authorization, copayment issues, initial counseling and start date.  Leron Croak, PharmD, BCPS Hematology/Oncology Clinical Pharmacist [facility] [phone] 06/01/2020 12:23 PM

## 2020-06-01 NOTE — Telephone Encounter (Signed)
Oral Oncology Patient Advocate Encounter  Received notification from [employer] that prior authorization for Cabometyx is required.  PA submitted by phone [phone] [place] [id number] Status is pending  Oral Oncology Clinic will continue to follow.  Gordon Thomas Patient [place] Phone [phone] Fax [phone] 06/01/2020 1:39 PM

## 2020-06-01 NOTE — Patient Instructions (Signed)
[facility] Discharge Instructions for Patients Receiving Chemotherapy  Today you received the following chemotherapy agents: opdivo  To help prevent nausea and vomiting after your treatment, we encourage you to take your nausea medication as directed.   If you develop nausea and vomiting that is not controlled by your nausea medication, call the clinic.   BELOW ARE SYMPTOMS THAT SHOULD BE REPORTED IMMEDIATELY:  *FEVER GREATER THAN 100.5 F  *CHILLS WITH OR WITHOUT FEVER  NAUSEA AND VOMITING THAT IS NOT CONTROLLED WITH YOUR NAUSEA MEDICATION  *UNUSUAL SHORTNESS OF BREATH  *UNUSUAL BRUISING OR BLEEDING  TENDERNESS IN MOUTH AND THROAT WITH OR WITHOUT PRESENCE OF ULCERS  *URINARY PROBLEMS  *BOWEL PROBLEMS  UNUSUAL RASH Items with * indicate a potential emergency and should be followed up as soon as possible.  Feel free to call the clinic should you have any questions or concerns. The clinic phone number is [phone].  Please show the CHEMO ALERT CARD at check-in to the Emergency Department and triage nurse.   

## 2020-06-01 NOTE — Progress Notes (Signed)
Hematology and Oncology Follow Up Visit  Gordon Thomas [id number] [birth year] 40 y.o. 06/01/2020 12:02 PM [name], MDShelton, [name], MD   Principle Diagnosis: 40 year old man with kidney cancer diagnosed in April 2021.  He developed stage IV clear-cell  with pulmonary involvement documented in September 2021.   Prior Therapy: He is status post right radical nephrectomy completed on June 12, 2019.  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  Ipilimumab 1 mg/kg and nivolumab 3 mg/kg given every 3 weeks. Cycle 1 given on March 01, 2020. He completed 4 cycles of therapy on May 04, 2020.  Current therapy: Opdivo 480 mg every 4 weeks to start on March 30th 2022.  Under consideration to start cabozantinib as well.  Interim History: Gordon Thomas returns today for repeat follow-up.  Since the last visit, he completed 4 cycles of ipilimumab and nivolumab without any major complications.  He denies any nausea, vomiting or abdominal pain.  He denies any weight loss or appetite changes.  He does report some occasional wound issues including feeling down and depressed but for the most part continues to be active and coping well with his diagnosis and treatment.     Medications: Updated on review. Current Outpatient Medications  Medication Sig Dispense Refill  . albuterol (VENTOLIN HFA) 108 (90 Base) MCG/ACT inhaler Inhale 1-2 puffs into the lungs every 6 (six) hours as needed for wheezing or shortness of breath.    . docusate sodium (COLACE) 100 MG capsule Take 1 capsule (100 mg total) by mouth 2 (two) times daily.    Marland Kitchen HYDROcodone-acetaminophen (NORCO) 5-325 MG tablet Take 1-2 tablets by mouth every 6 (six) hours as needed. 30 tablet 0  . prochlorperazine (COMPAZINE) 10 MG tablet Take 1 tablet (10 mg total) by mouth every 6 (six) hours as needed for nausea or vomiting. 30 tablet 0  . promethazine (PHENERGAN) 12.5 MG tablet Take 1 tablet (12.5  mg total) by mouth every 4 (four) hours as needed for nausea or vomiting. 15 tablet 0   No current facility-administered medications for this visit.     Allergies: No Known Allergies    Physical Exam:  Blood pressure 125/87, pulse 91, temperature 97.6 F (36.4 C), temperature source Temporal, resp. rate 16, height 5\' 5"  (1.651 m), weight 166 lb 14.4 oz (75.7 kg), SpO2 100 %.    ECOG:  0     General appearance: Alert, awake without any distress. Head: Atraumatic without abnormalities Oropharynx: Without any thrush or ulcers. Eyes: No scleral icterus. Lymph nodes: No lymphadenopathy noted in the cervical, supraclavicular, or axillary nodes Heart:regular rate and rhythm, without any murmurs or gallops.   Lung: Clear to auscultation without any rhonchi, wheezes or dullness to percussion. Abdomin: Soft, nontender without any shifting dullness or ascites. Musculoskeletal: No clubbing or cyanosis. Neurological: No motor or sensory deficits. Skin: No rashes or lesions.          Lab Results: Lab Results  Component Value Date   WBC 9.3 06/01/2020   HGB 15.9 06/01/2020   HCT 43.8 06/01/2020   MCV 85.7 06/01/2020   PLT 320 06/01/2020     Chemistry      Component Value Date/Time   NA 140 05/04/2020 1224   K 3.7 05/04/2020 1224   CL 105 05/04/2020 1224   CO2 25 05/04/2020 1224   BUN 18 05/04/2020 1224   CREATININE 1.55 (H) 05/04/2020 1224      Component Value Date/Time  CALCIUM 9.6 05/04/2020 1224   ALKPHOS 103 05/04/2020 1224   AST 30 05/04/2020 1224   ALT 37 05/04/2020 1224   BILITOT 0.5 05/04/2020 1224      CLINICAL DATA:  Right renal cell cancer, diagnosed February 2021, status post right nephrectomy with history of lung metastases, immunotherapy in progress. Intermittent right abdominal pain.  EXAM: CT CHEST, ABDOMEN, AND PELVIS WITH CONTRAST  TECHNIQUE: Multidetector CT imaging of the chest, abdomen and pelvis was performed following the  standard protocol during bolus administration of intravenous contrast.  CONTRAST:  160mL OMNIPAQUE IOHEXOL 300 MG/ML  SOLN  COMPARISON:  02/16/2020  FINDINGS: CT CHEST FINDINGS  Cardiovascular: Heart is normal in size.  No pericardial effusion.  No evidence of thoracic aortic aneurysm. Mild atherosclerotic calcifications of the aortic arch.  Mediastinum/Nodes: No suspicious mediastinal lymphadenopathy.  Mild triangular anterior mediastinal soft tissue (series 7/image 37), reflecting residual thymus.  Visualized thyroid is unremarkable.  Lungs/Pleura: Mixed response of bilateral pulmonary nodules. Dominant 8 mm nodule in the lateral right upper lobe (series 11/image 55) previously measured 4 mm. 5 mm nodule in the superior segment right lower lobe (series 11/image 63) previously measured 3 mm. However, a previously dominant 8 mm nodule in the right lung apex now measures 4 mm (series 11/image 28).  Additionally, there has been near complete resolution of the prior pleural-based nodularity along the inferior right hemithorax, with an equivocal residual 7 mm nodule at the medial right lung base (series 7/image 88), previously measuring 3.1 cm.  No focal consolidation.  No pleural effusion or pneumothorax.  Musculoskeletal: Mild degenerative changes of the thoracic spine.  CT ABDOMEN PELVIS FINDINGS  Hepatobiliary: 10 mm enhancing lesion in segment 4 (series 2/image 27), suspicious for metastasis. This is most conspicuous on the arterial phase of the current study, and as such, it is unclear whether this was present on the prior (which did not include an arterial phase).  Gallbladder is unremarkable. No intrahepatic or extrahepatic ductal dilatation.  Pancreas: Within normal limits.  Spleen: Within normal limits.  Adrenals/Urinary Tract: 2.8 cm mass abutting the right adrenal gland (series 7/image 103), suspicious for metastasis, previously 1.8  cm.  Left adrenal gland is within normal limits.  Status post right nephrectomy.  Left kidney is within normal limits.  No hydronephrosis.  Bladder is mildly thick-walled although underdistended.  Stomach/Bowel: Stomach is within normal limits.  No evidence of bowel obstruction.  Normal appendix (series 7/image 154).  Vascular/Lymphatic: No evidence of abdominal aortic aneurysm.  No suspicious abdominopelvic lymphadenopathy.  Reproductive: Prostate is unremarkable.  Other: No abdominopelvic ascites.  Tiny fat containing right inguinal hernia (series 7/image 191).  Musculoskeletal: Mild degenerative changes of the lumbar spine.  IMPRESSION: Status post right nephrectomy.  2.8 cm mass abutting the right adrenal gland, suspicious for metastasis, increased.  10 mm enhancing lesion in segment 4 of the liver, suspicious for metastasis.  Mixed response of bilateral pulmonary metastases, with index lesions as above.   Electronically Signed   By: Julian Hy M.D.   On: 05/27/2020 08:59     Impression and Plan:   40 year old with:  1.   Kidney cancer diagnosed and April 2021.  He subsequently developed stage IV clear-cell histology with pulmonary involvement.  His disease status was updated at this time and treatment options were reviewed.  CT scan on May 26, 2020 was personally reviewed and discussed with the patient.  He appears to have mixed response with reduction in some of his pulmonary nodules and  pathology and increase in his adrenal metastasis.  Based on these findings, I recommended adding cabozantinib at 40 mg daily in addition to continuing immunotherapy with Opdivo.  Complications associated with this treatment as well as the rationale for using this combination was discussed today in detail.  Complications include nausea, fatigue, anorexia, hypertension and increase in his liver function tests were reviewed.  I feel that  immunotherapy has been marginally successful but switching to oral targeted therapy combination may be a better option.  After discussion today he is agreeable to proceed.  2.  Immune mediated complications: He has not experienced any at this time.  We will continue to monitor thyroid disease, pneumonitis, arthritis and hypophysitis among others.  3.  IV access: No issues reported with his peripheral veins.  4.  Antiemetics: Compazine is available to him without any nausea or vomiting.  5.  Goals of care and prognosis discussion: This treatment is palliative although aggressive measures are warranted given his excellent performance status.  6.  Mental health and anxiety: This was discussed today in detail and appropriate resources was offered to him.  He appears to be well compensated and has excellent support system with his friends and family.  7.  Follow-up: In 4 weeks for evaluation.  30  minutes were spent on this visit.  The time was dedicated to reviewing imaging studies, discussing treatment options and future plan of care review.   Zola Button, MD 3/30/202212:02 PM

## 2020-06-06 ENCOUNTER — Other Ambulatory Visit (HOSPITAL_COMMUNITY): Payer: Self-pay

## 2020-06-06 MED ORDER — CABOZANTINIB S-MALATE 40 MG PO TABS: ORAL_TABLET | ORAL | 0 refills | Status: DC

## 2020-06-06 MED FILL — Cabozantinib S-Malate Tab 40 MG (Base Equivalent): ORAL | 30 days supply | Qty: 30 | Fill #0 | Status: CN

## 2020-06-07 ENCOUNTER — Telehealth: Payer: Self-pay | Admitting: *Deleted

## 2020-06-07 NOTE — Telephone Encounter (Signed)
Oral Chemotherapy Pharmacist Encounter   Spoke with patient today to follow up regarding patient's oral chemotherapy medication: Cabometyx (cabozantinib)  Discussed with patient that the prior authorization for medication was approved on 06/06/20, but his insurance requires that Cabometyx be filled through [facility]. Patient provided with phone number to set up medication shipment to his home (phone number: [phone]).  Additionally informed patient to reach out to the Oral Chemotherapy Clinic if copay for Cabometyx is too high, because we can help assist him in obtaining copay card to help with out of pocket costs.   Patient expressed appreciation and understanding. Patient provided with Oral Chemotherapy Clinic phone number and knows to call the office with questions or concerns.  Leron Croak, PharmD, BCPS Hematology/Oncology Clinical Pharmacist [facility] [phone] 06/07/2020 11:57 AM

## 2020-06-07 NOTE — Telephone Encounter (Signed)
Returned call to patient, he was asking about status of prior authorization for his cabometyx prescription, is pending.  Patient given Benjamine Mola Tilley's contact information.

## 2020-06-09 NOTE — Telephone Encounter (Signed)
Oral Chemotherapy Pharmacist Encounter  I spoke with patient for overview of: Cabometyx for the treatment of stage IV clear-cell renal cell carcinoma, in conjunction with nivolumab, planned duration until disease progression or unacceptable toxicity.   Counseled patient on administration, dosing, side effects, monitoring, drug-food interactions, safe handling, storage, and disposal.  Patient will take Cabometyx 40mg  tablets, 1 tablet (40mg ) by mouth once daily on an empty stomach, 1 hour before or 2 hours after a meal.  Patient knows to avoid grapefruit and grapefruit juice.  Cabometyx start date: 06/10/20  Adverse effects include but are not limited to: diarrhea, nausea, decreased appetite, fatigue, hypertension, hand-foot syndrome, decreased blood counts, and electrolyte abnormalities.  Patient will obtain anti diarrheal and alert the office of 4 or more loose stools above baseline. Patient has antiemetic available to him if needed for N/V while on Cabometyx.   Patient informed that Cabometyx should be held at least 3 weeks prior to any scheduled surgery (including dental surgery) and not resumed until at least 2 weeks after major surgery and until adequate wound healing is established.  Reviewed with patient importance of keeping a medication schedule and plan for any missed doses. No barriers to medication adherence identified.  Medication reconciliation performed and medication/allergy list updated.  Insurance authorization for Cabometyx has been obtained. Patient's insurance requires that Cabometyx be filled through [facility]. Medication will be delivered to patient's home on 06/09/20.   All questions answered.  Gordon Thomas voiced understanding and appreciation.   Medication education handout placed in mail for patient. Patient knows to call the office with questions or concerns. Oral Chemotherapy Clinic phone number provided to patient.   Leron Croak, PharmD,  BCPS Hematology/Oncology Clinical Pharmacist [facility] [phone] 06/09/2020 10:39 AM

## 2020-06-14 ENCOUNTER — Other Ambulatory Visit (HOSPITAL_COMMUNITY): Payer: Self-pay

## 2020-06-14 NOTE — Telephone Encounter (Signed)
Oral Oncology Patient Advocate Encounter  Prior Authorization for Cabometyx has been approved.    PA# [id number] Effective dates: 06/06/20 through 06/06/21  Patient must fill at [facility] will continue to follow.   [place] Patient [place] Phone [phone] Fax [phone] 06/14/2020 11:10 AM

## 2020-06-21 ENCOUNTER — Other Ambulatory Visit: Payer: Self-pay | Admitting: Oncology

## 2020-06-21 DIAGNOSIS — C641 Malignant neoplasm of right kidney, except renal pelvis: Secondary | ICD-10-CM

## 2020-06-30 ENCOUNTER — Ambulatory Visit: Payer: Managed Care, Other (non HMO) | Admitting: Oncology

## 2020-06-30 ENCOUNTER — Ambulatory Visit: Payer: Managed Care, Other (non HMO)

## 2020-06-30 ENCOUNTER — Other Ambulatory Visit: Payer: Managed Care, Other (non HMO)

## 2020-07-04 ENCOUNTER — Telehealth: Payer: Self-pay | Admitting: *Deleted

## 2020-07-04 NOTE — Telephone Encounter (Signed)
Received call from patient. He states he thinks he is having side effects from his Cabomeyx as evidenced by sores and blisters on his hands and feet, cuticles and very sore and tender, and bleed esily. He is also having very frequent stools-not watery diarrhea but very loose frequent stools; c/o rectal tenderness and abdominal discomfort. Advised that he could be seen in the [facility] with Sandi Mealy, PA tomorrow afternoon if he thought he would be ok until then. He said he would. Advised to use none fragrant thicker cream like udder cream/Eucerin on his hands and feet for now and then cover them with cotton socks, glioves  Scheduling message sent

## 2020-07-05 ENCOUNTER — Inpatient Hospital Stay (HOSPITAL_BASED_OUTPATIENT_CLINIC_OR_DEPARTMENT_OTHER): Payer: Managed Care, Other (non HMO) | Admitting: Medical

## 2020-07-05 ENCOUNTER — Other Ambulatory Visit: Payer: Self-pay

## 2020-07-05 ENCOUNTER — Inpatient Hospital Stay: Payer: Managed Care, Other (non HMO) | Attending: Oncology

## 2020-07-05 VITALS — BP 132/90 | HR 87 | Temp 97.8°F | Resp 20 | Ht 65.0 in | Wt 168.0 lb

## 2020-07-05 DIAGNOSIS — C649 Malignant neoplasm of unspecified kidney, except renal pelvis: Secondary | ICD-10-CM | POA: Insufficient documentation

## 2020-07-05 DIAGNOSIS — Z5112 Encounter for antineoplastic immunotherapy: Secondary | ICD-10-CM | POA: Insufficient documentation

## 2020-07-05 DIAGNOSIS — C641 Malignant neoplasm of right kidney, except renal pelvis: Secondary | ICD-10-CM | POA: Diagnosis not present

## 2020-07-05 DIAGNOSIS — C7800 Secondary malignant neoplasm of unspecified lung: Secondary | ICD-10-CM | POA: Diagnosis present

## 2020-07-05 DIAGNOSIS — Z79899 Other long term (current) drug therapy: Secondary | ICD-10-CM | POA: Diagnosis not present

## 2020-07-05 DIAGNOSIS — Z905 Acquired absence of kidney: Secondary | ICD-10-CM | POA: Insufficient documentation

## 2020-07-05 LAB — CMP (CANCER CENTER ONLY)
ALT: 119 U/L — ABNORMAL HIGH (ref 0–44)
AST: 97 U/L — ABNORMAL HIGH (ref 15–41)
Albumin: 4 g/dL (ref 3.5–5.0)
Alkaline Phosphatase: 113 U/L (ref 38–126)
Anion gap: 6 (ref 5–15)
BUN: 13 mg/dL (ref 6–20)
CO2: 26 mmol/L (ref 22–32)
Calcium: 8.9 mg/dL (ref 8.9–10.3)
Chloride: 106 mmol/L (ref 98–111)
Creatinine: 1.27 mg/dL — ABNORMAL HIGH (ref 0.61–1.24)
GFR, Estimated: >60 mL/min (ref >60)
Glucose, Bld: 102 mg/dL — ABNORMAL HIGH (ref 70–99)
Potassium: 4 mmol/L (ref 3.5–5.1)
Sodium: 138 mmol/L (ref 135–145)
Total Bilirubin: 0.4 mg/dL (ref 0.3–1.2)
Total Protein: 8 g/dL (ref 6.5–8.1)

## 2020-07-05 LAB — CBC WITH DIFFERENTIAL (CANCER CENTER ONLY)
Abs Immature Granulocytes: 0.01 10*3/uL (ref 0.00–0.07)
Basophils Absolute: 0.1 10*3/uL (ref 0.0–0.1)
Basophils Relative: 1 %
Eosinophils Absolute: 0.5 10*3/uL (ref 0.0–0.5)
Eosinophils Relative: 7 %
HCT: 41.6 % (ref 39.0–52.0)
Hemoglobin: 15.3 g/dL (ref 13.0–17.0)
Immature Granulocytes: 0 %
Lymphocytes Relative: 51 %
Lymphs Abs: 4.2 10*3/uL — ABNORMAL HIGH (ref 0.7–4.0)
MCH: 30.9 pg (ref 26.0–34.0)
MCHC: 36.8 g/dL — ABNORMAL HIGH (ref 30.0–36.0)
MCV: 84 fL (ref 80.0–100.0)
Monocytes Absolute: 0.5 10*3/uL (ref 0.1–1.0)
Monocytes Relative: 7 %
Neutro Abs: 2.7 10*3/uL (ref 1.7–7.7)
Neutrophils Relative %: 34 %
Platelet Count: 258 10*3/uL (ref 150–400)
RBC: 4.95 MIL/uL (ref 4.22–5.81)
RDW: 13.2 % (ref 11.5–15.5)
WBC Count: 7.9 10*3/uL (ref 4.0–10.5)
nRBC: 0 % (ref 0.0–0.2)

## 2020-07-05 LAB — TSH: TSH: 6.395 u[IU]/mL — ABNORMAL HIGH (ref 0.350–4.500)

## 2020-07-05 NOTE — Progress Notes (Signed)
[facility] Progress Note   Gordon Thomas [id number] 10-29-1980 40 y.o.  Gordon Thomas is managed by Dr. 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 continues to be managed by Dr. Alen Blew is currently treated with Opdivo and cabozantinib.  He is scheduled to be seen in follow-up on 07/13/2020.  Hand-foot syndrome: [name] was told to hold cabozantinib until his return visit on 07/13/2020.  He was also told to begin using udder cream understands.  Please see After Visit Summary for patient specific instructions.  Future Appointments  Date Time Provider [place]  07/13/2020  2:00 PM [facility] LAB [facility] None  07/13/2020  3:00 PM Wyatt Portela, MD [facility] None  07/13/2020  3:00 PM [facility] INFUSION [facility] None    No orders of the defined types were placed in this encounter.      Subjective:   Patient ID:  Gordon Thomas is a 40 y.o. (DOB 1980-04-10) male.  Chief Complaint: No chief complaint on file.   HPI Gordon Thomas  is a 40 y.o. 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  Mr. Enderle is managed by Dr. Alen Blew and he is currently treated with Opdivo and cabozantinib.  He began cabozantinib.  Approximately 3 weeks ago and began having sores on his hands and feet approximately 1 week ago.  He has sores on the base of his cuticles bilaterally with hard nodular painful masses on the palmar side of his fingers at the joints.  He reports that it is hard opening containers such as jars or bottles.  He is having no difficulty walking at this point.  He is also noted that he has had perirectal pain with wiping.  He is having at least 3-4 loose bowel movements  per day.   Medications: I have reviewed the patient's current medications.  Allergies: No Known Allergies  Past Medical History:  Diagnosis Date  . Anxiety   . Depression     Past Surgical History:  Procedure Laterality Date  . NO PAST SURGERIES    . ROBOT ASSISTED LAPAROSCOPIC NEPHRECTOMY Right 06/12/2019   Procedure: XI ROBOTIC ASSISTED LAPAROSCOPIC NEPHRECTOMY;  Surgeon: Ceasar Mons, MD;  Location: [facility];  Service: Urology;  Laterality: Right;    No family history on file.  Social History   Socioeconomic History  . Marital status: Married    Spouse name: Not on file  . Number of children: Not on file  . Years of education: Not on file  . Highest education level: Not on file  Occupational History  . Not on file  Tobacco Use  . Smoking status: Never Smoker  . Smokeless tobacco: Never Used  Vaping Use  . Vaping Use: Never used  Substance and Sexual Activity  . Alcohol use: Yes    Comment: [facility].  . Drug use: Never  . Sexual activity: Not on file  Other Topics Concern  . Not on file  Social History Narrative  . Not on file   Social Determinants of Health   Financial Resource Strain: Not on file  Food Insecurity: Not on file  Transportation Needs: Not on file  Physical Activity: Not on file  Stress: Not on file  Social Connections: Not on file  Intimate Partner  Violence: Not on file    Past Medical History, Surgical history, Social history, and Family history were reviewed and updated as appropriate.   Please see review of systems for further details on the patient's review from today.   Review of Systems:  Review of Systems  Constitutional: Negative for appetite change, chills, diaphoresis and fever.  Respiratory: Negative for cough, chest tightness and shortness of breath.   Cardiovascular: Negative for chest pain, palpitations and leg swelling.  Gastrointestinal: Negative for abdominal distention, abdominal pain, blood in stool,  constipation, diarrhea, nausea and vomiting.       3-4 loose stools per day. Perirectal tenderness with wiping.  Genitourinary: Negative for decreased urine volume and difficulty urinating.  Musculoskeletal:       Difficulty opening jars and bottles due to pain.  Skin:       Lesions along the cuticles of the hands bilaterally with palmar and plantar hard, tender nodules.  Neurological: Negative for weakness.    Objective:   Physical Exam:  BP 132/90 (BP Location: Right Arm, Patient Position: Sitting)   Pulse 87   Temp 97.8 F (36.6 C) (Tympanic)   Resp 20   Ht 5\' 5"  (1.651 m)   Wt 168 lb (76.2 kg)   SpO2 99%   BMI 27.96 kg/m  ECOG: 0  Physical Exam Constitutional:      General: He is not in acute distress.    Appearance: Normal appearance. He is not ill-appearing, toxic-appearing or diaphoretic.  HENT:     Head: Normocephalic and atraumatic.  Skin:    General: Skin is warm and dry.     Findings: Lesion present.     Comments: With several hard nodular lesions at the MIP joints of the palmar surfaces of the hands bilaterally.  At least 1 lesion noted on the medial surface of the distal left foot.  Multiple superficial lesions along the base of the cuticles bilaterally.  Neurological:     Mental Status: He is alert.     Coordination: Coordination normal.     Gait: Gait normal.  Psychiatric:        Mood and Affect: Mood normal.        Behavior: Behavior normal.        Thought Content: Thought content normal.        Judgment: Judgment normal.     Lab Review:     Component Value Date/Time   NA 139 06/01/2020 1125   K 4.1 06/01/2020 1125   CL 104 06/01/2020 1125   CO2 25 06/01/2020 1125   GLUCOSE 93 06/01/2020 1125   BUN 15 06/01/2020 1125   CREATININE 1.36 (H) 06/01/2020 1125   CALCIUM 9.0 06/01/2020 1125   PROT 8.8 (H) 06/01/2020 1125   ALBUMIN 4.3 06/01/2020 1125   AST 27 06/01/2020 1125   ALT 33 06/01/2020 1125   ALKPHOS 91 06/01/2020 1125   BILITOT  0.7 06/01/2020 1125   GFRNONAA >60 06/01/2020 1125   GFRAA >60 06/13/2019 0529       Component Value Date/Time   WBC 7.9 07/05/2020 1340   WBC 8.4 06/03/2019 1058   RBC 4.95 07/05/2020 1340   HGB 15.3 07/05/2020 1340   HCT 41.6 07/05/2020 1340   PLT 258 07/05/2020 1340   MCV 84.0 07/05/2020 1340   MCH 30.9 07/05/2020 1340   MCHC 36.8 (H) 07/05/2020 1340   RDW 13.2 07/05/2020 1340   LYMPHSABS 4.2 (H) 07/05/2020 1340   MONOABS 0.5 07/05/2020 1340   EOSABS  0.5 07/05/2020 1340   BASOSABS 0.1 07/05/2020 1340   -------------------------------  Imaging from last 24 hours (if applicable):  Radiology interpretation: No results found.      This patient was seen with Dr. Alen Blew with my treatment plan reviewed with him. He expressed agreement with my medical management of this patient.   Oncology attending addendum:  Patient seen and examined and agree with note above.  He has experienced pain in his upper and lower extremity predominantly in the palms and soles with mild erythema and nailbed discomfort.  This not interfering with his function without any evidence of desquamation or break in the skin integrity.  Based on these findings, I recommended a treatment break and reevaluation in the next 7 to 10 days and potentially restarting cabozantinib at 20 mg daily dosing.  All his questions were answered today to his satisfaction.  30  minutes were dedicated to this visit. The time was spent on reviewing laboratory data, discussing treatment options, discussing differential diagnosis and answering questions regarding future plan.

## 2020-07-05 NOTE — Progress Notes (Signed)
No nursing assessment, Sandi Mealy, PA aware

## 2020-07-05 NOTE — Telephone Encounter (Signed)
Contacted patient with Dr. Hazeline Junker direction to hold cancer medication - Carbometyx. Patient verbalized understanding/ Patient has appts today for lab and to be seen in [place] [facility].

## 2020-07-05 NOTE — Telephone Encounter (Signed)
I agree. He needs to hold his cancer medication as well.

## 2020-07-13 ENCOUNTER — Ambulatory Visit: Payer: Managed Care, Other (non HMO)

## 2020-07-13 ENCOUNTER — Inpatient Hospital Stay: Payer: Managed Care, Other (non HMO)

## 2020-07-13 ENCOUNTER — Other Ambulatory Visit: Payer: Managed Care, Other (non HMO)

## 2020-07-13 ENCOUNTER — Other Ambulatory Visit: Payer: Self-pay

## 2020-07-13 ENCOUNTER — Inpatient Hospital Stay (HOSPITAL_BASED_OUTPATIENT_CLINIC_OR_DEPARTMENT_OTHER): Payer: Managed Care, Other (non HMO) | Admitting: Oncology

## 2020-07-13 VITALS — BP 137/92 | HR 98 | Temp 97.8°F | Resp 17 | Ht 65.0 in | Wt 169.4 lb

## 2020-07-13 DIAGNOSIS — Z5112 Encounter for antineoplastic immunotherapy: Secondary | ICD-10-CM | POA: Diagnosis not present

## 2020-07-13 DIAGNOSIS — C641 Malignant neoplasm of right kidney, except renal pelvis: Secondary | ICD-10-CM

## 2020-07-13 LAB — CBC WITH DIFFERENTIAL (CANCER CENTER ONLY)
Abs Immature Granulocytes: 0.02 10*3/uL (ref 0.00–0.07)
Basophils Absolute: 0 10*3/uL (ref 0.0–0.1)
Basophils Relative: 1 %
Eosinophils Absolute: 0.3 10*3/uL (ref 0.0–0.5)
Eosinophils Relative: 3 %
HCT: 43.2 % (ref 39.0–52.0)
Hemoglobin: 15.5 g/dL (ref 13.0–17.0)
Immature Granulocytes: 0 %
Lymphocytes Relative: 46 %
Lymphs Abs: 4 10*3/uL (ref 0.7–4.0)
MCH: 30.6 pg (ref 26.0–34.0)
MCHC: 35.9 g/dL (ref 30.0–36.0)
MCV: 85.4 fL (ref 80.0–100.0)
Monocytes Absolute: 0.8 10*3/uL (ref 0.1–1.0)
Monocytes Relative: 9 %
Neutro Abs: 3.6 10*3/uL (ref 1.7–7.7)
Neutrophils Relative %: 41 %
Platelet Count: 255 10*3/uL (ref 150–400)
RBC: 5.06 MIL/uL (ref 4.22–5.81)
RDW: 13.9 % (ref 11.5–15.5)
WBC Count: 8.8 10*3/uL (ref 4.0–10.5)
nRBC: 0 % (ref 0.0–0.2)

## 2020-07-13 LAB — CMP (CANCER CENTER ONLY)
ALT: 87 U/L — ABNORMAL HIGH (ref 0–44)
AST: 46 U/L — ABNORMAL HIGH (ref 15–41)
Albumin: 4.1 g/dL (ref 3.5–5.0)
Alkaline Phosphatase: 111 U/L (ref 38–126)
Anion gap: 11 (ref 5–15)
BUN: 11 mg/dL (ref 6–20)
CO2: 26 mmol/L (ref 22–32)
Calcium: 9.5 mg/dL (ref 8.9–10.3)
Chloride: 105 mmol/L (ref 98–111)
Creatinine: 1.41 mg/dL — ABNORMAL HIGH (ref 0.61–1.24)
GFR, Estimated: >60 mL/min (ref >60)
Glucose, Bld: 87 mg/dL (ref 70–99)
Potassium: 3.9 mmol/L (ref 3.5–5.1)
Sodium: 142 mmol/L (ref 135–145)
Total Bilirubin: 0.8 mg/dL (ref 0.3–1.2)
Total Protein: 8.5 g/dL — ABNORMAL HIGH (ref 6.5–8.1)

## 2020-07-13 LAB — TSH: TSH: 4.234 u[IU]/mL — ABNORMAL HIGH (ref 0.320–4.118)

## 2020-07-13 MED ORDER — SODIUM CHLORIDE 0.9 % IV SOLN
480.0000 mg | Freq: Once | INTRAVENOUS | Status: AC
  Administered 2020-07-13: 480 mg via INTRAVENOUS
  Filled 2020-07-13: qty 48

## 2020-07-13 MED ORDER — CABOMETYX 20 MG PO TABS: 20.0000 mg | ORAL_TABLET | Freq: Every day | ORAL | 0 refills | Status: DC

## 2020-07-13 MED ORDER — SODIUM CHLORIDE 0.9 % IV SOLN
Freq: Once | INTRAVENOUS | Status: AC
  Filled 2020-07-13: qty 250

## 2020-07-13 NOTE — Patient Instructions (Signed)
[facility]  Discharge Instructions: ?Thank you for choosing [facility] to provide your oncology and hematology care.  ? ?If you have a lab appointment with the Cancer Center, please go directly to the Cancer Center and check in at the registration area. ?  ?Wear comfortable clothing and clothing appropriate for easy access to any Portacath or PICC line.  ? ?We strive to give you quality time with your provider. You may need to reschedule your appointment if you arrive late (15 or more minutes).  Arriving late affects you and other patients whose appointments are after yours.  Also, if you miss three or more appointments without notifying the office, you may be dismissed from the clinic at the provider?s discretion.    ?  ?For prescription refill requests, have your pharmacy contact our office and allow 72 hours for refills to be completed.   ? ?Today you received the following chemotherapy and/or immunotherapy agents Opdivo    ?  ?To help prevent nausea and vomiting after your treatment, we encourage you to take your nausea medication as directed. ? ?BELOW ARE SYMPTOMS THAT SHOULD BE REPORTED IMMEDIATELY: ?*FEVER GREATER THAN 100.4 F (38 ?C) OR HIGHER ?*CHILLS OR SWEATING ?*NAUSEA AND VOMITING THAT IS NOT CONTROLLED WITH YOUR NAUSEA MEDICATION ?*UNUSUAL SHORTNESS OF BREATH ?*UNUSUAL BRUISING OR BLEEDING ?*URINARY PROBLEMS (pain or burning when urinating, or frequent urination) ?*BOWEL PROBLEMS (unusual diarrhea, constipation, pain near the anus) ?TENDERNESS IN MOUTH AND THROAT WITH OR WITHOUT PRESENCE OF ULCERS (sore throat, sores in mouth, or a toothache) ?UNUSUAL RASH, SWELLING OR PAIN  ?UNUSUAL VAGINAL DISCHARGE OR ITCHING  ? ?Items with * indicate a potential emergency and should be followed up as soon as possible or go to the Emergency Department if any problems should occur. ? ?Please show the CHEMOTHERAPY ALERT CARD or IMMUNOTHERAPY ALERT CARD at check-in to the  Emergency Department and triage nurse. ? ?Should you have questions after your visit or need to cancel or reschedule your appointment, please contact [facility]  Dept: [phone]  and follow the prompts.  Office hours are 8:00 a.m. to 4:30 p.m. Monday - Friday. Please note that voicemails left after 4:00 p.m. may not be returned until the following business day.  We are closed weekends and major holidays. You have access to a nurse at all times for urgent questions. Please call the main number to the clinic Dept: [phone] and follow the prompts. ? ? ?For any non-urgent questions, you may also contact your provider using MyChart. We now offer e-Visits for anyone 18 and older to request care online for non-urgent symptoms. For details visit [email or web address]. ?  ?Also download the MyChart app! Go to the app store, search "MyChart", open the app, select [facility], and log in with your MyChart username and password. ? ?Due to Covid, a mask is required upon entering the hospital/clinic. If you do not have a mask, one will be given to you upon arrival. For doctor visits, patients may have 1 support person aged 18 or older with them. For treatment visits, patients cannot have anyone with them due to current Covid guidelines and our immunocompromised population.  ? ?

## 2020-07-13 NOTE — Progress Notes (Signed)
Hematology and Oncology Follow Up Visit  Gordon Thomas [id number] November 17, 1980 40 y.o. 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  He presented with localized disease in April 2021.   Prior Therapy: He is status post right radical nephrectomy completed on June 12, 2019.  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  Ipilimumab 1 mg/kg and nivolumab 3 mg/kg given every 3 weeks. Cycle 1 given on March 01, 2020. He completed 4 cycles of therapy on May 04, 2020.  Current therapy: Nivolumab 480 mg every 4 weeks to start on March 30th 2022.  Cabozantinib 40 mg started on June 10, 2020.    Interim History: [name] is here for repeat evaluation.  Since the last visit, he had developed dermatological toxicity associated with cabozantinib.  He reported of erythema, pain as well as nailbed changes associated with this treatment.  He was instructed to hold his treatment for the time being.  Since holding the medication last week he noticed improvement in his skin changes and pain in his palms and soles.  He had also perirectal irritation have also improved.  He is able to eat at this time and maintaining his weight adequately.  He does report some mild fatigue and tiredness.     Medications: Unchanged on review. Current Outpatient Medications  Medication Sig Dispense Refill  . albuterol (VENTOLIN HFA) 108 (90 Base) MCG/ACT inhaler Inhale 1-2 puffs into the lungs every 6 (six) hours as needed for wheezing or shortness of breath.    . CABOMETYX 40 MG tablet TAKE 1 TABLET DAILY ON AN EMPTY STOMACH, 1 HOUR BEFORE OR 2 HOURS AFTER MEALS 30 tablet 0  . docusate sodium (COLACE) 100 MG capsule Take 1 capsule (100 mg total) by mouth 2 (two) times daily.    Marland Kitchen HYDROcodone-acetaminophen (NORCO) 5-325 MG  tablet Take 1-2 tablets by mouth every 6 (six) hours as needed. 30 tablet 0  . prochlorperazine (COMPAZINE) 10 MG tablet Take 1 tablet (10 mg total) by mouth every 6 (six) hours as needed for nausea or vomiting. 30 tablet 0  . promethazine (PHENERGAN) 12.5 MG tablet Take 1 tablet (12.5 mg total) by mouth every 4 (four) hours as needed for nausea or vomiting. 15 tablet 0   No current facility-administered medications for this visit.     Allergies: No Known Allergies    Physical Exam:   Blood pressure (!) 137/92, pulse 98, temperature 97.8 F (36.6 C), temperature source Tympanic, resp. rate 17, height 5\' 5"  (1.651 m), weight 169 lb 6.4 oz (76.8 kg), SpO2 100 %.    ECOG:  0    General appearance: Comfortable appearing without any discomfort Head: Normocephalic without any trauma Oropharynx: Mucous membranes are moist and pink without any thrush or ulcers. Eyes: Pupils are equal and round reactive to light. Lymph nodes: No cervical, supraclavicular, inguinal or axillary lymphadenopathy.   Heart:regular rate and rhythm.  S1 and S2 without leg edema. Lung: Clear without any rhonchi or wheezes.  No dullness to percussion. Abdomin: Soft, nontender, nondistended with good bowel sounds.  No hepatosplenomegaly. Musculoskeletal: No joint deformity or effusion.  Full range of motion noted. Neurological: No deficits noted on motor, sensory and deep tendon reflex exam. Skin: Mild erythema noted on his palms. No nail changes.           Lab Results:  Lab Results  Component Value Date   WBC 8.8 07/13/2020   HGB 15.5 07/13/2020   HCT 43.2 07/13/2020   MCV 85.4 07/13/2020   PLT 255 07/13/2020     Chemistry      Component Value Date/Time   NA 138 07/05/2020 1340   K 4.0 07/05/2020 1340   CL 106 07/05/2020 1340   CO2 26 07/05/2020 1340   BUN 13 07/05/2020 1340   CREATININE 1.27 (H) 07/05/2020 1340      Component Value Date/Time   CALCIUM 8.9 07/05/2020 1340   ALKPHOS 113  07/05/2020 1340   AST 97 (H) 07/05/2020 1340   ALT 119 (H) 07/05/2020 1340   BILITOT 0.4 07/05/2020 1340          Impression and Plan:   40 year old with:  1.  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  He is currently on nivolumab and started on cabozantinib which she is taking for close to 4 weeks with dermatological toxicity.  Risks and benefits of restarting this treatment were reviewed at this time.  Potential dose reduction to 20 mg daily was reiterated.  Alternative options including axitinib, sunitinib up is operative among others.  After discussion today, we opted to proceed with cabozantinib at 20 mg dose reduction starting in the immediate future.   2.  Immune mediated complications: He has not experienced any autoimmune complications at this time.  These include pneumonitis, colitis or thyroid disease.  3.  IV access: Peripheral veins are currently in use without any issues.  4.  Antiemetics: No nausea or vomiting reported at this time Compazine is available to him.  5.  Goals of care and prognosis discussion: Therapy remains palliative although aggressive measures are warranted given his excellent performance status.  6.   Hand-foot syndrome: Related to cabozantinib and improved after withholding the medication.  Dose reduction should improve in the future.  7.  Follow-up: In 1 month for repeat evaluation.   30  minutes were dedicated to this encounter.  Time was spent on reviewing laboratory data, disease status update and outlining future plan of care.   Zola Button, MD 5/11/20222:18 PM

## 2020-07-13 NOTE — Progress Notes (Signed)
Per Dr. Alen Blew, ok to treat with elevated ALT.

## 2020-07-17 ENCOUNTER — Other Ambulatory Visit: Payer: Self-pay | Admitting: Oncology

## 2020-07-21 ENCOUNTER — Telehealth: Payer: Self-pay | Admitting: *Deleted

## 2020-07-21 NOTE — Telephone Encounter (Signed)
-----   Message from Wyatt Portela, MD sent at 07/21/2020  3:27 PM EDT ----- He can start now. Thanks ----- Message ----- From: Rolene Course, RN Sent: 07/21/2020   3:27 PM EDT To: Wyatt Portela, MD  Mr Arata called & said he has received his cabometyx 20 mg tablets.  He is asking exactly when he should start taking this medication.  Thanks.

## 2020-07-21 NOTE — Telephone Encounter (Signed)
PC to patient, advised him per Dr. Alen Blew he should start taking his cabometyx 20 mg now.  Patient verbalizes understanding.

## 2020-08-01 ENCOUNTER — Other Ambulatory Visit: Payer: Self-pay | Admitting: Oncology

## 2020-08-02 ENCOUNTER — Encounter: Payer: Self-pay | Admitting: Oncology

## 2020-08-11 ENCOUNTER — Inpatient Hospital Stay (HOSPITAL_BASED_OUTPATIENT_CLINIC_OR_DEPARTMENT_OTHER): Payer: Managed Care, Other (non HMO) | Admitting: Oncology

## 2020-08-11 ENCOUNTER — Other Ambulatory Visit: Payer: Self-pay

## 2020-08-11 ENCOUNTER — Inpatient Hospital Stay: Payer: Managed Care, Other (non HMO) | Attending: Oncology

## 2020-08-11 VITALS — BP 153/103 | HR 97 | Temp 97.8°F | Resp 17 | Ht 65.0 in | Wt 171.3 lb

## 2020-08-11 DIAGNOSIS — C649 Malignant neoplasm of unspecified kidney, except renal pelvis: Secondary | ICD-10-CM | POA: Diagnosis not present

## 2020-08-11 DIAGNOSIS — Z905 Acquired absence of kidney: Secondary | ICD-10-CM | POA: Insufficient documentation

## 2020-08-11 DIAGNOSIS — C641 Malignant neoplasm of right kidney, except renal pelvis: Secondary | ICD-10-CM

## 2020-08-11 DIAGNOSIS — Z79899 Other long term (current) drug therapy: Secondary | ICD-10-CM | POA: Insufficient documentation

## 2020-08-11 DIAGNOSIS — C7800 Secondary malignant neoplasm of unspecified lung: Secondary | ICD-10-CM | POA: Insufficient documentation

## 2020-08-11 LAB — CMP (CANCER CENTER ONLY)
ALT: 139 U/L — ABNORMAL HIGH (ref 0–44)
AST: 89 U/L — ABNORMAL HIGH (ref 15–41)
Albumin: 4 g/dL (ref 3.5–5.0)
Alkaline Phosphatase: 106 U/L (ref 38–126)
Anion gap: 10 (ref 5–15)
BUN: 13 mg/dL (ref 6–20)
CO2: 27 mmol/L (ref 22–32)
Calcium: 9.8 mg/dL (ref 8.9–10.3)
Chloride: 104 mmol/L (ref 98–111)
Creatinine: 1.42 mg/dL — ABNORMAL HIGH (ref 0.61–1.24)
GFR, Estimated: >60 mL/min (ref >60)
Glucose, Bld: 134 mg/dL — ABNORMAL HIGH (ref 70–99)
Potassium: 3.9 mmol/L (ref 3.5–5.1)
Sodium: 141 mmol/L (ref 135–145)
Total Bilirubin: 0.5 mg/dL (ref 0.3–1.2)
Total Protein: 8.2 g/dL — ABNORMAL HIGH (ref 6.5–8.1)

## 2020-08-11 LAB — CBC WITH DIFFERENTIAL (CANCER CENTER ONLY)
Abs Immature Granulocytes: 0.02 10*3/uL (ref 0.00–0.07)
Basophils Absolute: 0 10*3/uL (ref 0.0–0.1)
Basophils Relative: 0 %
Eosinophils Absolute: 0.3 10*3/uL (ref 0.0–0.5)
Eosinophils Relative: 4 %
HCT: 44.1 % (ref 39.0–52.0)
Hemoglobin: 15.8 g/dL (ref 13.0–17.0)
Immature Granulocytes: 0 %
Lymphocytes Relative: 46 %
Lymphs Abs: 4.2 10*3/uL — ABNORMAL HIGH (ref 0.7–4.0)
MCH: 31.8 pg (ref 26.0–34.0)
MCHC: 35.8 g/dL (ref 30.0–36.0)
MCV: 88.7 fL (ref 80.0–100.0)
Monocytes Absolute: 0.5 10*3/uL (ref 0.1–1.0)
Monocytes Relative: 6 %
Neutro Abs: 4 10*3/uL (ref 1.7–7.7)
Neutrophils Relative %: 44 %
Platelet Count: 285 10*3/uL (ref 150–400)
RBC: 4.97 MIL/uL (ref 4.22–5.81)
RDW: 14.5 % (ref 11.5–15.5)
WBC Count: 9.1 10*3/uL (ref 4.0–10.5)
nRBC: 0 % (ref 0.0–0.2)

## 2020-08-11 LAB — TSH: TSH: 3.256 u[IU]/mL (ref 0.320–4.118)

## 2020-08-11 NOTE — Progress Notes (Signed)
Hematology and Oncology Follow Up Visit  Gordon Thomas [id number] 09-Jan-1981 40 y.o. 08/11/2020 12:25 PM Gordon Thomas, MDShelton, Gordon Millin, MD   Principle Diagnosis: 40 year old man with kidney cancer diagnosed with localized disease in 2021.  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    Prior Therapy: He is status post right radical nephrectomy completed on June 12, 2019.  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  Ipilimumab 1 mg/kg and nivolumab 3 mg/kg given every 3 weeks. Cycle 1 given on March 01, 2020. He completed 4 cycles of therapy on May 04, 2020.  Current therapy: Nivolumab 480 mg every 4 weeks to start on March 30th 2022.  Cabozantinib 40 mg started on June 10, 2020.   His dose was reduced to 20 mg in May 2022.  Interim History: Gordon Thomas is here for a follow-up visit.  Since the last visit, he reports no major changes in his health.  He has tolerated the reduced dose of cabozantinib without any major complications.  He does report some irritation in his fingers and toes much improved with the lower dose.  He denies any GI toxicity at this time.  He denies any nausea, vomiting or diarrhea.  He continues to be active and attends activities of daily living.     Medications: Updated on review. Current Outpatient Medications  Medication Sig Dispense Refill   albuterol (VENTOLIN HFA) 108 (90 Base) MCG/ACT inhaler Inhale 1-2 puffs into the lungs every 6 (six) hours as needed for wheezing or shortness of breath.     CABOMETYX 20 MG tablet TAKE 1 TABLET DAILY. TAKE ON AN EMPTY STOMACH, 1 HOUR BEFORE OR 2 HOURS AFTER MEALS 30 tablet 0   docusate sodium (COLACE) 100 MG capsule Take 1 capsule (100 mg total) by mouth 2 (two) times daily.     HYDROcodone-acetaminophen (NORCO) 5-325 MG tablet Take 1-2 tablets by mouth every 6 (six) hours as needed. 30 tablet 0    prochlorperazine (COMPAZINE) 10 MG tablet Take 1 tablet (10 mg total) by mouth every 6 (six) hours as needed for nausea or vomiting. 30 tablet 0   promethazine (PHENERGAN) 12.5 MG tablet Take 1 tablet (12.5 mg total) by mouth every 4 (four) hours as needed for nausea or vomiting. 15 tablet 0   No current facility-administered medications for this visit.     Allergies: No Known Allergies    Physical Exam:   Blood pressure (!) 153/103, pulse 97, temperature 97.8 F (36.6 C), temperature source Tympanic, resp. rate 17, height 5\' 5"  (1.651 m), weight 171 lb 4.8 oz (77.7 kg), SpO2 98 %.     ECOG:  0    General appearance: Alert, awake without any distress. Head: Atraumatic without abnormalities Oropharynx: Without any thrush or ulcers. Eyes: No scleral icterus. Lymph nodes: No lymphadenopathy noted in the cervical, supraclavicular, or axillary nodes Heart:regular rate and rhythm, without any murmurs or gallops.   Lung: Clear to auscultation without any rhonchi, wheezes or dullness to percussion. Abdomin: Soft, nontender without any shifting dullness or ascites. Musculoskeletal: No clubbing or cyanosis. Neurological: No motor or sensory deficits. Skin: No rashes or lesions.             Lab Results: Lab Results  Component Value Date   WBC 8.8 07/13/2020   HGB 15.5 07/13/2020   HCT 43.2 07/13/2020   MCV 85.4 07/13/2020   PLT 255 07/13/2020  Chemistry      Component Value Date/Time   NA 142 07/13/2020 1403   K 3.9 07/13/2020 1403   CL 105 07/13/2020 1403   CO2 26 07/13/2020 1403   BUN 11 07/13/2020 1403   CREATININE 1.41 (H) 07/13/2020 1403      Component Value Date/Time   CALCIUM 9.5 07/13/2020 1403   ALKPHOS 111 07/13/2020 1403   AST 46 (H) 07/13/2020 1403   ALT 87 (H) 07/13/2020 1403   BILITOT 0.8 07/13/2020 1403          Impression and Plan:   43 year old with:  1.  Kidney cancer diagnosed in 2021.  He developed stage IV clear-cell  renal cell carcinoma with pulmonary involvement.  He has tolerated the cabozantinib at 40 mg dosing poorly with GI and dermatological toxicities and currently on 20 mg daily.  Risks and benefits of continuing this treatment with nivolumab were reviewed.  The plan is to repeat imaging studies before the next month of therapy.  He is agreeable to proceed at this time.   2.  Immune mediated complications: I continue to educate him about potential autoimmune complication including hepatitis, pneumonitis and thyroid disease.  3.  IV access: No issues reported with peripheral veins.  Port-A-Cath option has been deferred.  4.  Antiemetics: Compazine is available to him without any nausea or vomiting.  5.  Goals of care and prognosis discussion: His disease is incurable although aggressive measures are warranted at this time.  6.   Hand-foot syndrome: Improved significantly with the reduction of cabozantinib dose.  7.  Follow-up: In 4 weeks for the next cycle of therapy.   30  minutes were spent on this visit.  Time was dedicated to reviewing laboratory data, disease status update and outlining future plan of care.   Zola Button, MD 6/9/202212:25 PM

## 2020-08-29 ENCOUNTER — Other Ambulatory Visit: Payer: Self-pay | Admitting: Oncology

## 2020-09-01 ENCOUNTER — Other Ambulatory Visit: Payer: Self-pay

## 2020-09-01 ENCOUNTER — Ambulatory Visit (HOSPITAL_COMMUNITY)
Admission: RE | Admit: 2020-09-01 | Discharge: 2020-09-01 | Disposition: A | Discharge status: Home / Self Care | Payer: Managed Care, Other (non HMO) | Source: Ambulatory Visit | Attending: Oncology | Admitting: Oncology

## 2020-09-01 DIAGNOSIS — C7800 Secondary malignant neoplasm of unspecified lung: Secondary | ICD-10-CM | POA: Insufficient documentation

## 2020-09-01 DIAGNOSIS — C641 Malignant neoplasm of right kidney, except renal pelvis: Secondary | ICD-10-CM | POA: Diagnosis not present

## 2020-09-08 ENCOUNTER — Inpatient Hospital Stay: Payer: Managed Care, Other (non HMO) | Attending: Oncology

## 2020-09-08 ENCOUNTER — Inpatient Hospital Stay: Payer: Managed Care, Other (non HMO)

## 2020-09-08 ENCOUNTER — Inpatient Hospital Stay (HOSPITAL_BASED_OUTPATIENT_CLINIC_OR_DEPARTMENT_OTHER): Payer: Managed Care, Other (non HMO) | Admitting: Oncology

## 2020-09-08 ENCOUNTER — Other Ambulatory Visit: Payer: Self-pay

## 2020-09-08 VITALS — BP 139/94 | HR 85 | Temp 98.7°F | Resp 19 | Ht 65.0 in | Wt 169.0 lb

## 2020-09-08 DIAGNOSIS — C649 Malignant neoplasm of unspecified kidney, except renal pelvis: Secondary | ICD-10-CM | POA: Diagnosis present

## 2020-09-08 DIAGNOSIS — C7802 Secondary malignant neoplasm of left lung: Secondary | ICD-10-CM | POA: Diagnosis present

## 2020-09-08 DIAGNOSIS — I10 Essential (primary) hypertension: Secondary | ICD-10-CM | POA: Diagnosis not present

## 2020-09-08 DIAGNOSIS — C641 Malignant neoplasm of right kidney, except renal pelvis: Secondary | ICD-10-CM

## 2020-09-08 DIAGNOSIS — Z5112 Encounter for antineoplastic immunotherapy: Secondary | ICD-10-CM | POA: Insufficient documentation

## 2020-09-08 DIAGNOSIS — Z79899 Other long term (current) drug therapy: Secondary | ICD-10-CM | POA: Insufficient documentation

## 2020-09-08 DIAGNOSIS — C7971 Secondary malignant neoplasm of right adrenal gland: Secondary | ICD-10-CM | POA: Diagnosis not present

## 2020-09-08 DIAGNOSIS — E079 Disorder of thyroid, unspecified: Secondary | ICD-10-CM | POA: Diagnosis not present

## 2020-09-08 LAB — CBC WITH DIFFERENTIAL (CANCER CENTER ONLY)
Abs Immature Granulocytes: 0.01 10*3/uL (ref 0.00–0.07)
Basophils Absolute: 0 10*3/uL (ref 0.0–0.1)
Basophils Relative: 0 %
Eosinophils Absolute: 0.5 10*3/uL (ref 0.0–0.5)
Eosinophils Relative: 7 %
HCT: 42.3 % (ref 39.0–52.0)
Hemoglobin: 15.5 g/dL (ref 13.0–17.0)
Immature Granulocytes: 0 %
Lymphocytes Relative: 50 %
Lymphs Abs: 3.4 10*3/uL (ref 0.7–4.0)
MCH: 33 pg (ref 26.0–34.0)
MCHC: 36.6 g/dL — ABNORMAL HIGH (ref 30.0–36.0)
MCV: 90 fL (ref 80.0–100.0)
Monocytes Absolute: 0.5 10*3/uL (ref 0.1–1.0)
Monocytes Relative: 8 %
Neutro Abs: 2.4 10*3/uL (ref 1.7–7.7)
Neutrophils Relative %: 35 %
Platelet Count: 242 10*3/uL (ref 150–400)
RBC: 4.7 MIL/uL (ref 4.22–5.81)
RDW: 13.9 % (ref 11.5–15.5)
WBC Count: 6.8 10*3/uL (ref 4.0–10.5)
nRBC: 0 % (ref 0.0–0.2)

## 2020-09-08 LAB — CMP (CANCER CENTER ONLY)
ALT: 166 U/L — ABNORMAL HIGH (ref 0–44)
AST: 89 U/L — ABNORMAL HIGH (ref 15–41)
Albumin: 4 g/dL (ref 3.5–5.0)
Alkaline Phosphatase: 98 U/L (ref 38–126)
Anion gap: 11 (ref 5–15)
BUN: 14 mg/dL (ref 6–20)
CO2: 22 mmol/L (ref 22–32)
Calcium: 9.1 mg/dL (ref 8.9–10.3)
Chloride: 106 mmol/L (ref 98–111)
Creatinine: 1.39 mg/dL — ABNORMAL HIGH (ref 0.61–1.24)
GFR, Estimated: >60 mL/min (ref >60)
Glucose, Bld: 110 mg/dL — ABNORMAL HIGH (ref 70–99)
Potassium: 4.1 mmol/L (ref 3.5–5.1)
Sodium: 139 mmol/L (ref 135–145)
Total Bilirubin: 0.5 mg/dL (ref 0.3–1.2)
Total Protein: 7.9 g/dL (ref 6.5–8.1)

## 2020-09-08 LAB — TSH: TSH: 3.791 u[IU]/mL (ref 0.320–4.118)

## 2020-09-08 MED ORDER — SODIUM CHLORIDE 0.9 % IV SOLN
Freq: Once | INTRAVENOUS | Status: AC
Start: 2020-09-08 — End: 2020-09-08
  Filled 2020-09-08: qty 250

## 2020-09-08 MED ORDER — SODIUM CHLORIDE 0.9 % IV SOLN
480.0000 mg | Freq: Once | INTRAVENOUS | Status: AC
  Administered 2020-09-08: 480 mg via INTRAVENOUS
  Filled 2020-09-08: qty 48

## 2020-09-08 NOTE — Patient Instructions (Signed)
[facility]  ? Discharge Instructions: ?Thank you for choosing [facility] to provide your oncology and hematology care.  ? ?If you have a lab appointment with the Cancer Center, please go directly to the Cancer Center and check in at the registration area. ?  ?Wear comfortable clothing and clothing appropriate for easy access to any Portacath or PICC line.  ? ?We strive to give you quality time with your provider. You may need to reschedule your appointment if you arrive late (15 or more minutes).  Arriving late affects you and other patients whose appointments are after yours.  Also, if you miss three or more appointments without notifying the office, you may be dismissed from the clinic at the provider?s discretion.    ?  ?For prescription refill requests, have your pharmacy contact our office and allow 72 hours for refills to be completed.   ? ?Today you received the following chemotherapy and/or immunotherapy agents: nivolumab    ?  ?To help prevent nausea and vomiting after your treatment, we encourage you to take your nausea medication as directed. ? ?BELOW ARE SYMPTOMS THAT SHOULD BE REPORTED IMMEDIATELY: ?*FEVER GREATER THAN 100.4 F (38 ?C) OR HIGHER ?*CHILLS OR SWEATING ?*NAUSEA AND VOMITING THAT IS NOT CONTROLLED WITH YOUR NAUSEA MEDICATION ?*UNUSUAL SHORTNESS OF BREATH ?*UNUSUAL BRUISING OR BLEEDING ?*URINARY PROBLEMS (pain or burning when urinating, or frequent urination) ?*BOWEL PROBLEMS (unusual diarrhea, constipation, pain near the anus) ?TENDERNESS IN MOUTH AND THROAT WITH OR WITHOUT PRESENCE OF ULCERS (sore throat, sores in mouth, or a toothache) ?UNUSUAL RASH, SWELLING OR PAIN  ?UNUSUAL VAGINAL DISCHARGE OR ITCHING  ? ?Items with * indicate a potential emergency and should be followed up as soon as possible or go to the Emergency Department if any problems should occur. ? ?Please show the CHEMOTHERAPY ALERT CARD or IMMUNOTHERAPY ALERT CARD at check-in  to the Emergency Department and triage nurse. ? ?Should you have questions after your visit or need to cancel or reschedule your appointment, please contact [facility]  Dept: [phone]  and follow the prompts.  Office hours are 8:00 a.m. to 4:30 p.m. Monday - Friday. Please note that voicemails left after 4:00 p.m. may not be returned until the following business day.  We are closed weekends and major holidays. You have access to a nurse at all times for urgent questions. Please call the main number to the clinic Dept: [phone] and follow the prompts. ? ? ?For any non-urgent questions, you may also contact your provider using MyChart. We now offer e-Visits for anyone 18 and older to request care online for non-urgent symptoms. For details visit [email or web address]. ?  ?Also download the MyChart app! Go to the app store, search "MyChart", open the app, select [facility], and log in with your MyChart username and password. ? ?Due to Covid, a mask is required upon entering the hospital/clinic. If you do not have a mask, one will be given to you upon arrival. For doctor visits, patients may have 1 support person aged 18 or older with them. For treatment visits, patients cannot have anyone with them due to current Covid guidelines and our immunocompromised population.  ? ?

## 2020-09-08 NOTE — Progress Notes (Addendum)
Hematology and Oncology Follow Up Visit  Gordon Thomas [id number] Jul 04, 1980 40 y.o. 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    Prior Therapy: He is status post right radical nephrectomy completed on June 12, 2019.  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  Ipilimumab 1 mg/kg and nivolumab 3 mg/kg given every 3 weeks. Cycle 1 given on March 01, 2020. He completed 4 cycles of therapy on May 04, 2020.  Current therapy: Nivolumab 480 mg every 4 weeks to start on March 30th 2022.  Cabozantinib 40 mg started on June 10, 2020.   His dose was reduced to 20 mg in May 2022.  He is here for next cycle of therapy.  Interim History: [name] presents today for a follow-up evaluation.  Since her last visit, he reports no major changes in his health.  He has tolerated the combination of nivolumab and reduced dose Cabometyx without any major complaints.  He denies any nausea, vomiting or abdominal pain.  He denies any worsening diarrhea.  Continues to have a mild element of hand-foot syndrome that is manageable.  His performance status quality of life remained excellent.     Medications: Reviewed without changes. Current Outpatient Medications  Medication Sig Dispense Refill   albuterol (VENTOLIN HFA) 108 (90 Base) MCG/ACT inhaler Inhale 1-2 puffs into the lungs every 6 (six) hours as needed for wheezing or shortness of breath.     CABOMETYX 20 MG tablet TAKE 1 TABLET DAILY. TAKE ON AN EMPTY STOMACH, 1 HOUR BEFORE OR 2 HOURS AFTER MEALS 30 tablet 0   docusate sodium (COLACE) 100 MG capsule Take 1 capsule (100 mg total) by mouth 2 (two) times daily.     HYDROcodone-acetaminophen (NORCO) 5-325 MG tablet Take 1-2 tablets by mouth every 6 (six) hours as needed. 30 tablet 0   prochlorperazine  (COMPAZINE) 10 MG tablet Take 1 tablet (10 mg total) by mouth every 6 (six) hours as needed for nausea or vomiting. 30 tablet 0   promethazine (PHENERGAN) 12.5 MG tablet Take 1 tablet (12.5 mg total) by mouth every 4 (four) hours as needed for nausea or vomiting. 15 tablet 0   No current facility-administered medications for this visit.     Allergies: No Known Allergies    Physical Exam:     Blood pressure (!) 139/94, pulse 85, temperature 98.7 F (37.1 C), resp. rate 19, height 5\' 5"  (1.651 m), weight 169 lb (76.7 kg), SpO2 99 %.    ECOG:  0     General appearance: Comfortable appearing without any discomfort Head: Normocephalic without any trauma Oropharynx: Mucous membranes are moist and pink without any thrush or ulcers. Eyes: Pupils are equal and round reactive to light. Lymph nodes: No cervical, supraclavicular, inguinal or axillary lymphadenopathy.   Heart:regular rate and rhythm.  S1 and S2 without leg edema. Lung: Clear without any rhonchi or wheezes.  No dullness to percussion. Abdomin: Soft, nontender, nondistended with good bowel sounds.  No hepatosplenomegaly. Musculoskeletal: No joint deformity or effusion.  Full range of motion noted. Neurological: No deficits noted on motor, sensory and deep tendon reflex exam. Skin: No petechial rash or dryness.  Appeared moist.               Lab Results: Lab Results  Component Value Date   WBC 9.1 08/11/2020  HGB 15.8 08/11/2020   HCT 44.1 08/11/2020   MCV 88.7 08/11/2020   PLT 285 08/11/2020     Chemistry      Component Value Date/Time   NA 141 08/11/2020 1230   K 3.9 08/11/2020 1230   CL 104 08/11/2020 1230   CO2 27 08/11/2020 1230   BUN 13 08/11/2020 1230   CREATININE 1.42 (H) 08/11/2020 1230      Component Value Date/Time   CALCIUM 9.8 08/11/2020 1230   ALKPHOS 106 08/11/2020 1230   AST 89 (H) 08/11/2020 1230   ALT 139 (H) 08/11/2020 1230   BILITOT 0.5 08/11/2020 1230        IMPRESSION: 1. Evidence of good treatment response, with decreased size of the scattered bilateral pulmonary metastases and right adrenal metastasis. 2. 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 3. No evidence of local recurrence or new/progressive metastatic disease in the chest, abdomen, or pelvis on this noncontrast examination. 4. Diffuse hepatic steatosis with focal sparing along the falciform ligament and gallbladder fossa.    Impression and Plan:   40 year old with:  1.  Stage IV clear-cell renal cell carcinoma with pulmonary involvement in September 2021.  He is currently on cabozantinib with nivolumab therapy without any major complications.  CT scan on September 01, 2020 showed positive response to therapy with decrease in his pulmonary, adrenal and possibly hepatic nodules.  Risks and benefits of continuing this treatment were discussed at this time.  Complications that include GI toxicity, hypertension and dermatological issues were reiterated.    After discussion today, he is agreeable to continue at this time.   2.  Immune mediated complications: He has not experienced any autoimmune issues at this time.  We will continue to monitor pneumonitis, colitis and thyroid disease among others.  3.  IV access: No need for Port-A-Cath at this time with peripheral veins currently in use.  We will continue to address this issue in the future.  4.  Antiemetics: No nausea or vomiting reported at this time.  Compazine is available for him.  5.  Goals of care and prognosis discussion: Therapy remains palliative although aggressive measures are warranted given his excellent performance status.  6.   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  7.  Follow-up: He will return in 4 weeks for the next cycle of therapy.   30  minutes  were dedicated to this encounter.  Total spent on reviewing laboratory data, reviewing imaging studies, disease status update, addressing complications related to cancer and cancer therapy.   Zola Button, MD 7/7/202212:31 PM  Liver function test obtained on September 08, 2020 were reviewed and continues to show persistent elevation in his AST and ALT but normal bilirubin.  Given the mild elevation I have opted to continue with nivolumab treatments.  I suspect the minor elevation in his liver function test is related mostly to cabozantinib.  He had fluctuation in his liver function test previously with normalization subsequently.  We will continue to monitor and will hold nivolumab if his ALT is above 200.

## 2020-09-08 NOTE — Progress Notes (Signed)
Per Dr. Alen Blew ok to proceed with treatment without resulted CMP

## 2020-09-26 ENCOUNTER — Other Ambulatory Visit: Payer: Self-pay | Admitting: Oncology

## 2020-09-28 ENCOUNTER — Other Ambulatory Visit: Payer: Self-pay

## 2020-09-28 ENCOUNTER — Other Ambulatory Visit (HOSPITAL_COMMUNITY): Payer: Self-pay

## 2020-09-28 ENCOUNTER — Telehealth: Payer: Self-pay | Admitting: Oncology

## 2020-09-28 MED ORDER — NYSTATIN 100000 UNIT/ML MT SUSP
OROMUCOSAL | 0 refills | Status: DC
  Filled 2020-09-28: qty 400, 13d supply, fill #0

## 2020-09-28 MED ORDER — MAGIC MOUTHWASH W/LIDOCAINE: 5.0000 mL | Freq: Four times a day (QID) | ORAL | 0 refills | Status: DC | PRN

## 2020-09-28 NOTE — Telephone Encounter (Signed)
Rescheduled 08/03 appointment to 08/04 per provider pal, patient has been called and notified.

## 2020-10-03 NOTE — Progress Notes (Signed)
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  PRIOR THERAPY: 1) He is status post right radical nephrectomy completed on June 12, 2019.  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  2) Ipilimumab 1 mg/kg and nivolumab 3 mg/kg given every 3 weeks. Cycle 1 given on March 01, 2020. He completed 4 cycles of therapy on May 04, 2020.  CURRENT THERAPY: Nivolumab 480 mg every 4 weeks to start on March 30th 2022.  Cabozantinib 40 mg started on June 10, 2020.   His dose was reduced to 20 mg in May 2022.  He is here for next cycle of therapy.  INTERVAL HISTORY: Gordon Thomas 40 y.o. male returns to clinic today for a follow-up visit for his renal cell carcinoma.  The patient was last seen in the clinic on 09/08/2020.  The patient tolerated his last cycle of treatment fairly well without any major concerning adverse side effects. The patient has been having some persistent elevations in his LFTs recently which Dr. Alen Blew attributes to his Carbozantinib.  The patient denies excessive Tylenol use or any alcohol use.  He has some mild hand-foot syndrome which is manageable now that he is on a lower dose of Carbozantinib.    Otherwise, in the interval since his last appointment, he was treated last week for strep throat. He was given antibiotics and a short course of steroids. His symptoms have resolved at this time. Otherwise he denies any recent fever or chills. He has some occasional night sweats but attributes it to a warm sleeping environment. He lost a few pounds but states he has a strong appetite. He states he has been contentious about making healthy dietary choices which he attributes his weight loss too. He denies any rashes or skin changes except for  that hand-foot syndrome and two small areas of hypopigmentation on his left knuckle which is not raised, swollen, tender, itchy, or painful.  He denies any cough, shortness of breath, or chest pain.  He denies any nausea, vomiting, diarrhea, constipation, or abdominal pain.  He denies any back pain.  He denies any hematuria or dysuria.  The patient is here today for evaluation and repeat blood work before starting the next cycle of treatment    MEDICAL HISTORY: Past Medical History:  Diagnosis Date   Anxiety    Depression     ALLERGIES:  has No Known Allergies.  MEDICATIONS:  Current Outpatient Medications  Medication Sig Dispense Refill   albuterol (VENTOLIN HFA) 108 (90 Base) MCG/ACT inhaler Inhale 1-2 puffs into the lungs every 6 (six) hours as needed for wheezing or shortness of breath.     CABOMETYX 20 MG tablet TAKE 1 TABLET DAILY. TAKE ON AN EMPTY STOMACH, 1 HOUR BEFORE OR 2 HOURS AFTER MEALS 30 tablet 0   docusate sodium (COLACE) 100 MG capsule Take 1 capsule (100 mg total) by mouth 2 (two) times daily. (Patient not taking: Reported on 10/06/2020)     prochlorperazine (COMPAZINE) 10 MG tablet Take 1 tablet (10 mg total) by mouth every 6 (six) hours as needed for nausea or vomiting. (Patient not taking: Reported on 10/06/2020) 30 tablet 0   promethazine (PHENERGAN) 12.5 MG tablet Take 1 tablet (12.5 mg total) by mouth every 4 (four) hours as needed for nausea or  vomiting. (Patient not taking: Reported on 10/06/2020) 15 tablet 0   No current facility-administered medications for this visit.    SURGICAL HISTORY:  Past Surgical History:  Procedure Laterality Date   NO PAST SURGERIES     ROBOT ASSISTED LAPAROSCOPIC NEPHRECTOMY Right 06/12/2019   Procedure: XI ROBOTIC ASSISTED LAPAROSCOPIC NEPHRECTOMY;  Surgeon: Ceasar Mons, MD;  Location: [facility];  Service: Urology;  Laterality: Right;    REVIEW OF SYSTEMS:   Review of Systems  Constitutional: Negative for appetite change,  chills, fatigue, fever and unexpected weight change.  HENT: Negative for mouth sores, nosebleeds, sore throat (resolved) and trouble swallowing.   Eyes: Negative for eye problems and icterus.  Respiratory: Negative for cough, hemoptysis, shortness of breath and wheezing.   Cardiovascular: Negative for chest pain and leg swelling.  Gastrointestinal: Negative for abdominal pain, constipation, diarrhea, nausea and vomiting.  Genitourinary: Negative for bladder incontinence, difficulty urinating, dysuria, frequency and hematuria.   Musculoskeletal: Negative for back pain, gait problem, neck pain and neck stiffness.  Skin: Negative for itching and rash.  Neurological: Negative for dizziness, extremity weakness, gait problem, headaches, light-headedness and seizures.  Hematological: Negative for adenopathy. Does not bruise/bleed easily.  Psychiatric/Behavioral: Negative for confusion, depression and sleep disturbance. The patient is not nervous/anxious.     PHYSICAL EXAMINATION:  Blood pressure 136/90, pulse 91, temperature (!) 96.8 F (36 C), temperature source Tympanic, resp. rate 18, weight 166 lb 9 oz (75.6 kg), SpO2 100 %.  ECOG PERFORMANCE STATUS: 1  Physical Exam  Constitutional: Oriented to person, place, and time and well-developed, well-nourished, and in no distress. HENT:  Head: Normocephalic and atraumatic.  Mouth/Throat: Oropharynx is clear and moist. No oropharyngeal exudate.  Eyes: Conjunctivae are normal. Right eye exhibits no discharge. Left eye exhibits no discharge. No scleral icterus.  Neck: Normal range of motion. Neck supple.  Cardiovascular: Normal rate, regular rhythm, normal heart sounds and intact distal pulses.   Pulmonary/Chest: Effort normal and breath sounds normal. No respiratory distress. No wheezes. No rales.  Abdominal: Soft. Bowel sounds are normal. Exhibits no distension and no mass. There is no tenderness.  Musculoskeletal: Normal range of motion. Exhibits  no edema.  Lymphadenopathy:    No cervical adenopathy.  Neurological: Alert and oriented to person, place, and time. Exhibits normal muscle tone. Gait normal. Coordination normal.  Skin: Skin is warm and dry. No rash noted. Not diaphoretic. No erythema. No pallor.  Psychiatric: Mood, memory and judgment normal.  Vitals reviewed.  LABORATORY DATA: Lab Results  Component Value Date   WBC 7.5 10/06/2020   HGB 16.9 10/06/2020   HCT 46.2 10/06/2020   MCV 92.4 10/06/2020   PLT 313 10/06/2020      Chemistry      Component Value Date/Time   NA 141 10/06/2020 0939   K 3.7 10/06/2020 0939   CL 105 10/06/2020 0939   CO2 24 10/06/2020 0939   BUN 18 10/06/2020 0939   CREATININE 1.44 (H) 10/06/2020 0939      Component Value Date/Time   CALCIUM 9.7 10/06/2020 0939   ALKPHOS 90 10/06/2020 0939   AST 40 10/06/2020 0939   ALT 82 (H) 10/06/2020 0939   BILITOT 0.6 10/06/2020 0939       RADIOGRAPHIC STUDIES:  No results found.   ASSESSMENT/PLAN:  40 year old with:   1.  Stage IV clear-cell renal cell carcinoma with pulmonary involvement in September 2021.   He is currently on cabozantinib with nivolumab therapy without any major complications.  CT  scan on September 01, 2020 showed positive response to therapy with decrease in his pulmonary, adrenal and possibly hepatic nodules.  Risks and benefits of continuing this treatment were discussed at this time.  Complications that include GI toxicity, hypertension and dermatological issues were reiterated.     After discussion today, he is agreeable to continue at this time if his labs permit.    2.  Immune mediated complications: He has not experienced any autoimmune issues at this time.  We will continue to monitor pneumonitis, colitis and thyroid disease among others.   3.  IV access: No need for Port-A-Cath at this time with peripheral veins currently in use.  We will continue to address this issue in the future.   4.  Antiemetics: No  nausea or vomiting reported at this time.  Compazine is available for him.    5.  Goals of care and prognosis discussion: Therapy remains palliative although aggressive measures are warranted given his excellent performance status.   6.   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  7. LFT's: He had fluctuation in his liver function test previously with normalization subsequently. Per Dr. Hazeline Junker last note, he suspects the minor elevation in his liver function test is related mostly to cabozantinib.    We will continue to monitor and will hold nivolumab if his ALT is above 200. Labs improved today.    7.  Follow-up: He will return in 4 weeks for the next cycle of therapy      No orders of the defined types were placed in this encounter.     The total time spent in the appointment was 20-29 minutes.   Kamoni Depree L Thadius Smisek, PA-C 10/06/20

## 2020-10-05 ENCOUNTER — Ambulatory Visit: Payer: Managed Care, Other (non HMO) | Admitting: Oncology

## 2020-10-05 ENCOUNTER — Other Ambulatory Visit: Payer: Managed Care, Other (non HMO)

## 2020-10-05 ENCOUNTER — Ambulatory Visit: Payer: Managed Care, Other (non HMO)

## 2020-10-06 ENCOUNTER — Inpatient Hospital Stay: Payer: Managed Care, Other (non HMO) | Attending: Physician Assistant

## 2020-10-06 ENCOUNTER — Encounter: Payer: Self-pay | Admitting: Physician Assistant

## 2020-10-06 ENCOUNTER — Inpatient Hospital Stay (HOSPITAL_BASED_OUTPATIENT_CLINIC_OR_DEPARTMENT_OTHER): Payer: Managed Care, Other (non HMO) | Admitting: Physician Assistant

## 2020-10-06 ENCOUNTER — Inpatient Hospital Stay: Payer: Managed Care, Other (non HMO)

## 2020-10-06 ENCOUNTER — Other Ambulatory Visit: Payer: Self-pay

## 2020-10-06 VITALS — BP 136/90 | HR 91 | Temp 96.8°F | Resp 18 | Wt 166.6 lb

## 2020-10-06 DIAGNOSIS — C7800 Secondary malignant neoplasm of unspecified lung: Secondary | ICD-10-CM | POA: Diagnosis present

## 2020-10-06 DIAGNOSIS — I10 Essential (primary) hypertension: Secondary | ICD-10-CM | POA: Diagnosis not present

## 2020-10-06 DIAGNOSIS — C649 Malignant neoplasm of unspecified kidney, except renal pelvis: Secondary | ICD-10-CM | POA: Insufficient documentation

## 2020-10-06 DIAGNOSIS — C641 Malignant neoplasm of right kidney, except renal pelvis: Secondary | ICD-10-CM | POA: Diagnosis not present

## 2020-10-06 DIAGNOSIS — Z79899 Other long term (current) drug therapy: Secondary | ICD-10-CM | POA: Diagnosis not present

## 2020-10-06 DIAGNOSIS — E079 Disorder of thyroid, unspecified: Secondary | ICD-10-CM | POA: Diagnosis not present

## 2020-10-06 DIAGNOSIS — Z5112 Encounter for antineoplastic immunotherapy: Secondary | ICD-10-CM | POA: Diagnosis not present

## 2020-10-06 LAB — CBC WITH DIFFERENTIAL (CANCER CENTER ONLY)
Abs Immature Granulocytes: 0.01 10*3/uL (ref 0.00–0.07)
Basophils Absolute: 0 10*3/uL (ref 0.0–0.1)
Basophils Relative: 0 %
Eosinophils Absolute: 0.4 10*3/uL (ref 0.0–0.5)
Eosinophils Relative: 6 %
HCT: 46.2 % (ref 39.0–52.0)
Hemoglobin: 16.9 g/dL (ref 13.0–17.0)
Immature Granulocytes: 0 %
Lymphocytes Relative: 54 %
Lymphs Abs: 4 10*3/uL (ref 0.7–4.0)
MCH: 33.8 pg (ref 26.0–34.0)
MCHC: 36.6 g/dL — ABNORMAL HIGH (ref 30.0–36.0)
MCV: 92.4 fL (ref 80.0–100.0)
Monocytes Absolute: 0.5 10*3/uL (ref 0.1–1.0)
Monocytes Relative: 6 %
Neutro Abs: 2.6 10*3/uL (ref 1.7–7.7)
Neutrophils Relative %: 34 %
Platelet Count: 313 10*3/uL (ref 150–400)
RBC: 5 MIL/uL (ref 4.22–5.81)
RDW: 12.9 % (ref 11.5–15.5)
WBC Count: 7.5 10*3/uL (ref 4.0–10.5)
nRBC: 0 % (ref 0.0–0.2)

## 2020-10-06 LAB — CMP (CANCER CENTER ONLY)
ALT: 82 U/L — ABNORMAL HIGH (ref 0–44)
AST: 40 U/L (ref 15–41)
Albumin: 4 g/dL (ref 3.5–5.0)
Alkaline Phosphatase: 90 U/L (ref 38–126)
Anion gap: 12 (ref 5–15)
BUN: 18 mg/dL (ref 6–20)
CO2: 24 mmol/L (ref 22–32)
Calcium: 9.7 mg/dL (ref 8.9–10.3)
Chloride: 105 mmol/L (ref 98–111)
Creatinine: 1.44 mg/dL — ABNORMAL HIGH (ref 0.61–1.24)
GFR, Estimated: >60 mL/min (ref >60)
Glucose, Bld: 119 mg/dL — ABNORMAL HIGH (ref 70–99)
Potassium: 3.7 mmol/L (ref 3.5–5.1)
Sodium: 141 mmol/L (ref 135–145)
Total Bilirubin: 0.6 mg/dL (ref 0.3–1.2)
Total Protein: 8 g/dL (ref 6.5–8.1)

## 2020-10-06 LAB — TSH: TSH: 3.343 u[IU]/mL (ref 0.320–4.118)

## 2020-10-06 MED ORDER — SODIUM CHLORIDE 0.9 % IV SOLN
Freq: Once | INTRAVENOUS | Status: AC
  Filled 2020-10-06: qty 250

## 2020-10-06 MED ORDER — NIVOLUMAB CHEMO INJECTION 100 MG/10ML
480.0000 mg | Freq: Once | INTRAVENOUS | Status: AC
Start: 2020-10-06 — End: 2020-10-06
  Administered 2020-10-06: 480 mg via INTRAVENOUS
  Filled 2020-10-06: qty 48

## 2020-10-06 NOTE — Patient Instructions (Signed)
[facility]   ?Discharge Instructions: ?Thank you for choosing [facility] to provide your oncology and hematology care.  ? ?If you have a lab appointment with the Cancer Center, please go directly to the Cancer Center and check in at the registration area. ?  ?Wear comfortable clothing and clothing appropriate for easy access to any Portacath or PICC line.  ? ?We strive to give you quality time with your provider. You may need to reschedule your appointment if you arrive late (15 or more minutes).  Arriving late affects you and other patients whose appointments are after yours.  Also, if you miss three or more appointments without notifying the office, you may be dismissed from the clinic at the provider?s discretion.    ?  ?For prescription refill requests, have your pharmacy contact our office and allow 72 hours for refills to be completed.   ? ?Today you received the following chemotherapy and/or immunotherapy agents: Nivolumab (Opdivo)    ?  ?To help prevent nausea and vomiting after your treatment, we encourage you to take your nausea medication as directed. ? ?BELOW ARE SYMPTOMS THAT SHOULD BE REPORTED IMMEDIATELY: ?*FEVER GREATER THAN 100.4 F (38 ?C) OR HIGHER ?*CHILLS OR SWEATING ?*NAUSEA AND VOMITING THAT IS NOT CONTROLLED WITH YOUR NAUSEA MEDICATION ?*UNUSUAL SHORTNESS OF BREATH ?*UNUSUAL BRUISING OR BLEEDING ?*URINARY PROBLEMS (pain or burning when urinating, or frequent urination) ?*BOWEL PROBLEMS (unusual diarrhea, constipation, pain near the anus) ?TENDERNESS IN MOUTH AND THROAT WITH OR WITHOUT PRESENCE OF ULCERS (sore throat, sores in mouth, or a toothache) ?UNUSUAL RASH, SWELLING OR PAIN  ?UNUSUAL VAGINAL DISCHARGE OR ITCHING  ? ?Items with * indicate a potential emergency and should be followed up as soon as possible or go to the Emergency Department if any problems should occur. ? ?Please show the CHEMOTHERAPY ALERT CARD or IMMUNOTHERAPY ALERT CARD at  check-in to the Emergency Department and triage nurse. ? ?Should you have questions after your visit or need to cancel or reschedule your appointment, please contact [facility]  Dept: [phone]  and follow the prompts.  Office hours are 8:00 a.m. to 4:30 p.m. Monday - Friday. Please note that voicemails left after 4:00 p.m. may not be returned until the following business day.  We are closed weekends and major holidays. You have access to a nurse at all times for urgent questions. Please call the main number to the clinic Dept: [phone] and follow the prompts. ? ? ?For any non-urgent questions, you may also contact your provider using MyChart. We now offer e-Visits for anyone 18 and older to request care online for non-urgent symptoms. For details visit [email or web address]. ?  ?Also download the MyChart app! Go to the app store, search "MyChart", open the app, select [facility], and log in with your MyChart username and password. ? ?Due to Covid, a mask is required upon entering the hospital/clinic. If you do not have a mask, one will be given to you upon arrival. For doctor visits, patients may have 1 support person aged 18 or older with them. For treatment visits, patients cannot have anyone with them due to current Covid guidelines and our immunocompromised population.  ? ?

## 2020-10-06 NOTE — Progress Notes (Signed)
Per Cassandra Heilingoetter, PA-C - okay to treat with elevated LFTs.

## 2020-10-24 ENCOUNTER — Other Ambulatory Visit: Payer: Self-pay | Admitting: Oncology

## 2020-11-03 ENCOUNTER — Other Ambulatory Visit: Payer: Self-pay

## 2020-11-03 ENCOUNTER — Other Ambulatory Visit: Payer: Managed Care, Other (non HMO)

## 2020-11-03 ENCOUNTER — Ambulatory Visit: Payer: Managed Care, Other (non HMO)

## 2020-11-03 ENCOUNTER — Inpatient Hospital Stay: Payer: Managed Care, Other (non HMO) | Attending: Oncology

## 2020-11-03 ENCOUNTER — Inpatient Hospital Stay: Payer: Managed Care, Other (non HMO)

## 2020-11-03 ENCOUNTER — Inpatient Hospital Stay (HOSPITAL_BASED_OUTPATIENT_CLINIC_OR_DEPARTMENT_OTHER): Payer: Managed Care, Other (non HMO) | Admitting: Oncology

## 2020-11-03 VITALS — BP 135/96 | HR 92 | Temp 99.5°F | Resp 18 | Wt 171.1 lb

## 2020-11-03 DIAGNOSIS — Z5112 Encounter for antineoplastic immunotherapy: Secondary | ICD-10-CM | POA: Diagnosis present

## 2020-11-03 DIAGNOSIS — T451X5A Adverse effect of antineoplastic and immunosuppressive drugs, initial encounter: Secondary | ICD-10-CM | POA: Insufficient documentation

## 2020-11-03 DIAGNOSIS — C641 Malignant neoplasm of right kidney, except renal pelvis: Secondary | ICD-10-CM

## 2020-11-03 DIAGNOSIS — R7989 Other specified abnormal findings of blood chemistry: Secondary | ICD-10-CM | POA: Diagnosis not present

## 2020-11-03 DIAGNOSIS — C7802 Secondary malignant neoplasm of left lung: Secondary | ICD-10-CM | POA: Diagnosis present

## 2020-11-03 DIAGNOSIS — C649 Malignant neoplasm of unspecified kidney, except renal pelvis: Secondary | ICD-10-CM | POA: Insufficient documentation

## 2020-11-03 DIAGNOSIS — Z79899 Other long term (current) drug therapy: Secondary | ICD-10-CM | POA: Insufficient documentation

## 2020-11-03 DIAGNOSIS — L271 Localized skin eruption due to drugs and medicaments taken internally: Secondary | ICD-10-CM | POA: Insufficient documentation

## 2020-11-03 DIAGNOSIS — Z905 Acquired absence of kidney: Secondary | ICD-10-CM | POA: Insufficient documentation

## 2020-11-03 DIAGNOSIS — R197 Diarrhea, unspecified: Secondary | ICD-10-CM | POA: Diagnosis not present

## 2020-11-03 LAB — CMP (CANCER CENTER ONLY)
ALT: 92 U/L — ABNORMAL HIGH (ref 0–44)
AST: 57 U/L — ABNORMAL HIGH (ref 15–41)
Albumin: 3.9 g/dL (ref 3.5–5.0)
Alkaline Phosphatase: 102 U/L (ref 38–126)
Anion gap: 12 (ref 5–15)
BUN: 13 mg/dL (ref 6–20)
CO2: 21 mmol/L — ABNORMAL LOW (ref 22–32)
Calcium: 9.3 mg/dL (ref 8.9–10.3)
Chloride: 107 mmol/L (ref 98–111)
Creatinine: 1.3 mg/dL — ABNORMAL HIGH (ref 0.61–1.24)
GFR, Estimated: >60 mL/min (ref >60)
Glucose, Bld: 147 mg/dL — ABNORMAL HIGH (ref 70–99)
Potassium: 3.7 mmol/L (ref 3.5–5.1)
Sodium: 140 mmol/L (ref 135–145)
Total Bilirubin: 0.4 mg/dL (ref 0.3–1.2)
Total Protein: 7.6 g/dL (ref 6.5–8.1)

## 2020-11-03 LAB — CBC WITH DIFFERENTIAL (CANCER CENTER ONLY)
Abs Immature Granulocytes: 0.01 10*3/uL (ref 0.00–0.07)
Basophils Absolute: 0 10*3/uL (ref 0.0–0.1)
Basophils Relative: 1 %
Eosinophils Absolute: 0.3 10*3/uL (ref 0.0–0.5)
Eosinophils Relative: 5 %
HCT: 40.6 % (ref 39.0–52.0)
Hemoglobin: 15.1 g/dL (ref 13.0–17.0)
Immature Granulocytes: 0 %
Lymphocytes Relative: 46 %
Lymphs Abs: 3.5 10*3/uL (ref 0.7–4.0)
MCH: 33.9 pg (ref 26.0–34.0)
MCHC: 37.2 g/dL — ABNORMAL HIGH (ref 30.0–36.0)
MCV: 91.2 fL (ref 80.0–100.0)
Monocytes Absolute: 0.4 10*3/uL (ref 0.1–1.0)
Monocytes Relative: 6 %
Neutro Abs: 3.1 10*3/uL (ref 1.7–7.7)
Neutrophils Relative %: 42 %
Platelet Count: 247 10*3/uL (ref 150–400)
RBC: 4.45 MIL/uL (ref 4.22–5.81)
RDW: 12.2 % (ref 11.5–15.5)
WBC Count: 7.5 10*3/uL (ref 4.0–10.5)
nRBC: 0 % (ref 0.0–0.2)

## 2020-11-03 LAB — TSH: TSH: 3.542 u[IU]/mL (ref 0.320–4.118)

## 2020-11-03 MED ORDER — SODIUM CHLORIDE 0.9 % IV SOLN
480.0000 mg | Freq: Once | INTRAVENOUS | Status: AC
  Administered 2020-11-03: 480 mg via INTRAVENOUS
  Filled 2020-11-03: qty 48

## 2020-11-03 MED ORDER — SODIUM CHLORIDE 0.9 % IV SOLN: Freq: Once | INTRAVENOUS | Status: AC

## 2020-11-03 NOTE — Progress Notes (Signed)
Gordon Thomas aware of elevated liver enzymes, okay to proceed with tx as scheduled.

## 2020-11-03 NOTE — Progress Notes (Signed)
Hematology and Oncology Follow Up Visit  Gordon Thomas [id number] March 14, 1980 40 y.o. 11/03/2020 12:40 PM Gordon Thomas, MDShelton, Gordon Millin, MD   Principle Diagnosis: 40 year old man with kidney cancer diagnosed in 2021.  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   Prior Therapy: He is status post right radical nephrectomy completed on June 12, 2019.  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  Ipilimumab 1 mg/kg and nivolumab 3 mg/kg given every 3 weeks. Cycle 1 given on March 01, 2020. He completed 4 cycles of therapy on May 04, 2020.  Current therapy: Nivolumab 480 mg every 4 weeks to start on March 30th 2022.  Cabozantinib 40 mg started on June 10, 2020.   His dose was reduced to 20 mg in May 2022.  He returns for the next cycle of therapy.  Interim History: Gordon Thomas is here for a follow-up visit.  Since the last visit, he reports feeling well without any major complaints.  He continues to tolerate current therapy without any issues.  He denies any nausea, vomiting or abdominal pain.  He denies any worsening diarrhea.  He does report loose bowel habits at times.  He denies any worsening hand-foot syndrome or dermatological toxicities.  He denies any pruritus.     Medications: Updated on review. Current Outpatient Medications  Medication Sig Dispense Refill   albuterol (VENTOLIN HFA) 108 (90 Base) MCG/ACT inhaler Inhale 1-2 puffs into the lungs every 6 (six) hours as needed for wheezing or shortness of breath.     CABOMETYX 20 MG tablet TAKE 1 TABLET DAILY ON AN EMPTY STOMACH, 1 HOUR BEFORE OR 2 HOURS AFTER A MEAL 30 tablet 0   docusate sodium (COLACE) 100 MG capsule Take 1 capsule (100 mg total) by mouth 2 (two) times daily. (Patient not taking: Reported on 10/06/2020)     prochlorperazine (COMPAZINE) 10 MG tablet Take 1 tablet (10 mg total) by mouth every 6 (six) hours as needed  for nausea or vomiting. (Patient not taking: Reported on 10/06/2020) 30 tablet 0   promethazine (PHENERGAN) 12.5 MG tablet Take 1 tablet (12.5 mg total) by mouth every 4 (four) hours as needed for nausea or vomiting. (Patient not taking: Reported on 10/06/2020) 15 tablet 0   No current facility-administered medications for this visit.     Allergies: No Known Allergies    Physical Exam:    Blood pressure (!) 135/96, pulse 92, temperature 99.5 F (37.5 C), temperature source Oral, resp. rate 18, weight 171 lb 1 oz (77.6 kg), SpO2 100 %.      ECOG:  0     General appearance: Alert, awake without any distress. Head: Atraumatic without abnormalities Oropharynx: Without any thrush or ulcers. Eyes: No scleral icterus. Lymph nodes: No lymphadenopathy noted in the cervical, supraclavicular, or axillary nodes Heart:regular rate and rhythm, without any murmurs or gallops.   Lung: Clear to auscultation without any rhonchi, wheezes or dullness to percussion. Abdomin: Soft, nontender without any shifting dullness or ascites. Musculoskeletal: No clubbing or cyanosis. Neurological: No motor or sensory deficits. Skin: No rashes or lesions.              Lab Results: Lab Results  Component Value Date   WBC 7.5 10/06/2020   HGB 16.9 10/06/2020   HCT 46.2 10/06/2020   MCV 92.4 10/06/2020   PLT 313 10/06/2020     Chemistry      Component Value Date/Time  NA 141 10/06/2020 0939   K 3.7 10/06/2020 0939   CL 105 10/06/2020 0939   CO2 24 10/06/2020 0939   BUN 18 10/06/2020 0939   CREATININE 1.44 (H) 10/06/2020 0939      Component Value Date/Time   CALCIUM 9.7 10/06/2020 0939   ALKPHOS 90 10/06/2020 0939   AST 40 10/06/2020 0939   ALT 82 (H) 10/06/2020 0939   BILITOT 0.6 10/06/2020 0939           Impression and Plan:   57 year old with:  1.  Kidney cancer diagnosed in 2021.  He was found to have stage IV clear-cell with pulmonary involvement.  His disease  status was updated at this time.  Risks and benefits of continuing this current treatment were discussed at this time.  Complication associated with Cabometyx and nivolumab were reviewed including dermatological toxicity as well as GI complaints.  He is agreeable to continue the plan is to update his staging scan in 1 month.  He is agreeable to proceed at this time.   2.  Immune mediated complications: I continue to educate him about potential complication related pneumonitis, colitis and thyroid disease.  3.  IV access: Peripheral veins are currently in use without any complications.  4.  Antiemetics: Compazine is available to him without any nausea or vomiting.  5.  Goals of care and prognosis discussion: His disease is incurable although aggressive measures are warranted given his excellent performance status.  6.   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  7.  Elevated liver function test: Related to to therapy at this time.  His AST and ALT is improved in August 2022.  Case will be repeated with each visit.  8.  Follow-up: In 1 month for repeat evaluation.   30  minutes were dedicated to this encounter.  Total spent on reviewing laboratory data, reviewing imaging studies, disease status update, addressing complications related to cancer and cancer therapy.   Gordon Button, MD 9/1/202212:40 PM

## 2020-11-03 NOTE — Patient Instructions (Signed)
[facility]  Discharge Instructions: ?Thank you for choosing [facility] to provide your oncology and hematology care.  ? ?If you have a lab appointment with the Cancer Center, please go directly to the Cancer Center and check in at the registration area. ?  ?Wear comfortable clothing and clothing appropriate for easy access to any Portacath or PICC line.  ? ?We strive to give you quality time with your provider. You may need to reschedule your appointment if you arrive late (15 or more minutes).  Arriving late affects you and other patients whose appointments are after yours.  Also, if you miss three or more appointments without notifying the office, you may be dismissed from the clinic at the provider?s discretion.    ?  ?For prescription refill requests, have your pharmacy contact our office and allow 72 hours for refills to be completed.   ? ?Today you received the following chemotherapy and/or immunotherapy agents Opdivo    ?  ?To help prevent nausea and vomiting after your treatment, we encourage you to take your nausea medication as directed. ? ?BELOW ARE SYMPTOMS THAT SHOULD BE REPORTED IMMEDIATELY: ?*FEVER GREATER THAN 100.4 F (38 ?C) OR HIGHER ?*CHILLS OR SWEATING ?*NAUSEA AND VOMITING THAT IS NOT CONTROLLED WITH YOUR NAUSEA MEDICATION ?*UNUSUAL SHORTNESS OF BREATH ?*UNUSUAL BRUISING OR BLEEDING ?*URINARY PROBLEMS (pain or burning when urinating, or frequent urination) ?*BOWEL PROBLEMS (unusual diarrhea, constipation, pain near the anus) ?TENDERNESS IN MOUTH AND THROAT WITH OR WITHOUT PRESENCE OF ULCERS (sore throat, sores in mouth, or a toothache) ?UNUSUAL RASH, SWELLING OR PAIN  ?UNUSUAL VAGINAL DISCHARGE OR ITCHING  ? ?Items with * indicate a potential emergency and should be followed up as soon as possible or go to the Emergency Department if any problems should occur. ? ?Please show the CHEMOTHERAPY ALERT CARD or IMMUNOTHERAPY ALERT CARD at check-in to the  Emergency Department and triage nurse. ? ?Should you have questions after your visit or need to cancel or reschedule your appointment, please contact [facility]  Dept: [phone]  and follow the prompts.  Office hours are 8:00 a.m. to 4:30 p.m. Monday - Friday. Please note that voicemails left after 4:00 p.m. may not be returned until the following business day.  We are closed weekends and major holidays. You have access to a nurse at all times for urgent questions. Please call the main number to the clinic Dept: [phone] and follow the prompts. ? ? ?For any non-urgent questions, you may also contact your provider using MyChart. We now offer e-Visits for anyone 18 and older to request care online for non-urgent symptoms. For details visit [email or web address]. ?  ?Also download the MyChart app! Go to the app store, search "MyChart", open the app, select [facility], and log in with your MyChart username and password. ? ?Due to Covid, a mask is required upon entering the hospital/clinic. If you do not have a mask, one will be given to you upon arrival. For doctor visits, patients may have 1 support person aged 18 or older with them. For treatment visits, patients cannot have anyone with them due to current Covid guidelines and our immunocompromised population.  ? ?

## 2020-11-04 ENCOUNTER — Ambulatory Visit: Payer: Managed Care, Other (non HMO) | Admitting: Oncology

## 2020-11-04 ENCOUNTER — Telehealth: Payer: Self-pay | Admitting: Oncology

## 2020-11-04 NOTE — Telephone Encounter (Signed)
Scheduled follow-up appointment per 9/1 los. Patient is aware. 

## 2020-11-22 ENCOUNTER — Other Ambulatory Visit: Payer: Self-pay | Admitting: Oncology

## 2020-11-29 ENCOUNTER — Ambulatory Visit (HOSPITAL_COMMUNITY)
Admission: RE | Admit: 2020-11-29 | Discharge: 2020-11-29 | Disposition: A | Discharge status: Home / Self Care | Payer: Managed Care, Other (non HMO) | Source: Ambulatory Visit | Attending: Oncology | Admitting: Oncology

## 2020-11-29 ENCOUNTER — Ambulatory Visit (HOSPITAL_COMMUNITY): Payer: Managed Care, Other (non HMO)

## 2020-11-29 ENCOUNTER — Other Ambulatory Visit: Payer: Self-pay

## 2020-11-29 DIAGNOSIS — C7801 Secondary malignant neoplasm of right lung: Secondary | ICD-10-CM | POA: Insufficient documentation

## 2020-11-29 DIAGNOSIS — K760 Fatty (change of) liver, not elsewhere classified: Secondary | ICD-10-CM | POA: Insufficient documentation

## 2020-11-29 DIAGNOSIS — C641 Malignant neoplasm of right kidney, except renal pelvis: Secondary | ICD-10-CM

## 2020-11-29 DIAGNOSIS — Z905 Acquired absence of kidney: Secondary | ICD-10-CM | POA: Insufficient documentation

## 2020-12-01 ENCOUNTER — Inpatient Hospital Stay (HOSPITAL_BASED_OUTPATIENT_CLINIC_OR_DEPARTMENT_OTHER): Payer: Managed Care, Other (non HMO) | Admitting: Oncology

## 2020-12-01 ENCOUNTER — Inpatient Hospital Stay: Payer: Managed Care, Other (non HMO)

## 2020-12-01 ENCOUNTER — Other Ambulatory Visit: Payer: Self-pay

## 2020-12-01 VITALS — BP 136/90

## 2020-12-01 VITALS — BP 169/99 | HR 84 | Temp 98.0°F | Resp 17 | Ht 65.0 in | Wt 168.5 lb

## 2020-12-01 DIAGNOSIS — Z5112 Encounter for antineoplastic immunotherapy: Secondary | ICD-10-CM | POA: Diagnosis not present

## 2020-12-01 DIAGNOSIS — C641 Malignant neoplasm of right kidney, except renal pelvis: Secondary | ICD-10-CM

## 2020-12-01 LAB — CMP (CANCER CENTER ONLY)
ALT: 82 U/L — ABNORMAL HIGH (ref 0–44)
AST: 48 U/L — ABNORMAL HIGH (ref 15–41)
Albumin: 4.1 g/dL (ref 3.5–5.0)
Alkaline Phosphatase: 80 U/L (ref 38–126)
Anion gap: 12 (ref 5–15)
BUN: 15 mg/dL (ref 6–20)
CO2: 24 mmol/L (ref 22–32)
Calcium: 9.4 mg/dL (ref 8.9–10.3)
Chloride: 106 mmol/L (ref 98–111)
Creatinine: 1.5 mg/dL — ABNORMAL HIGH (ref 0.61–1.24)
GFR, Estimated: >60 mL/min (ref >60)
Glucose, Bld: 99 mg/dL (ref 70–99)
Potassium: 3.6 mmol/L (ref 3.5–5.1)
Sodium: 142 mmol/L (ref 135–145)
Total Bilirubin: 0.5 mg/dL (ref 0.3–1.2)
Total Protein: 7.9 g/dL (ref 6.5–8.1)

## 2020-12-01 LAB — CBC WITH DIFFERENTIAL (CANCER CENTER ONLY)
Abs Immature Granulocytes: 0.01 10*3/uL (ref 0.00–0.07)
Basophils Absolute: 0 10*3/uL (ref 0.0–0.1)
Basophils Relative: 1 %
Eosinophils Absolute: 0.5 10*3/uL (ref 0.0–0.5)
Eosinophils Relative: 6 %
HCT: 43.8 % (ref 39.0–52.0)
Hemoglobin: 15.9 g/dL (ref 13.0–17.0)
Immature Granulocytes: 0 %
Lymphocytes Relative: 50 %
Lymphs Abs: 3.8 10*3/uL (ref 0.7–4.0)
MCH: 33.5 pg (ref 26.0–34.0)
MCHC: 36.3 g/dL — ABNORMAL HIGH (ref 30.0–36.0)
MCV: 92.4 fL (ref 80.0–100.0)
Monocytes Absolute: 0.5 10*3/uL (ref 0.1–1.0)
Monocytes Relative: 7 %
Neutro Abs: 2.7 10*3/uL (ref 1.7–7.7)
Neutrophils Relative %: 36 %
Platelet Count: 258 10*3/uL (ref 150–400)
RBC: 4.74 MIL/uL (ref 4.22–5.81)
RDW: 12.2 % (ref 11.5–15.5)
WBC Count: 7.5 10*3/uL (ref 4.0–10.5)
nRBC: 0 % (ref 0.0–0.2)

## 2020-12-01 LAB — TSH: TSH: 2.885 u[IU]/mL (ref 0.320–4.118)

## 2020-12-01 MED ORDER — SODIUM CHLORIDE 0.9 % IV SOLN: Freq: Once | INTRAVENOUS | Status: AC

## 2020-12-01 MED ORDER — SODIUM CHLORIDE 0.9 % IV SOLN
480.0000 mg | Freq: Once | INTRAVENOUS | Status: AC
  Administered 2020-12-01: 480 mg via INTRAVENOUS
  Filled 2020-12-01: qty 48

## 2020-12-01 NOTE — Progress Notes (Signed)
Hematology and Oncology Follow Up Visit  Gordon Thomas [id number] 03/30/1980 40 y.o. 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   Prior Therapy: He is status post right radical nephrectomy completed on June 12, 2019.  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  Ipilimumab 1 mg/kg and nivolumab 3 mg/kg given every 3 weeks. Cycle 1 given on March 01, 2020. He completed 4 cycles of therapy on May 04, 2020.  Current therapy: Nivolumab 480 mg every 4 weeks to start on March 30th 2022.  Cabozantinib 40 mg started on June 10, 2020.   His dose was reduced to 20 mg in May 2022.  If presents for the next cycle of treatment.  Interim History: Gordon Thomas returns today for repeat evaluation.  Since her last visit, he reports no major changes in his health.  He denies any recent hospitalizations or illnesses.  He denies any excessive fatigue, tiredness or weakness.  He denies any diarrhea or skin rash.  He denies any shortness of breath or difficulty breathing.     Medications: Reviewed without changes Current Outpatient Medications  Medication Sig Dispense Refill   albuterol (VENTOLIN HFA) 108 (90 Base) MCG/ACT inhaler Inhale 1-2 puffs into the lungs every 6 (six) hours as needed for wheezing or shortness of breath.     CABOMETYX 20 MG tablet TAKE 1 TABLET DAILY ON AN EMPTY STOMACH, 1 HOUR BEFORE OR 2 HOURS AFTER A MEAL 30 tablet 0   docusate sodium (COLACE) 100 MG capsule Take 1 capsule (100 mg total) by mouth 2 (two) times daily.     prochlorperazine (COMPAZINE) 10 MG tablet Take 1 tablet (10 mg total) by mouth every 6 (six) hours as needed for nausea or vomiting. 30 tablet 0   promethazine (PHENERGAN) 12.5 MG tablet Take 1 tablet (12.5 mg  total) by mouth every 4 (four) hours as needed for nausea or vomiting. 15 tablet 0   No current facility-administered medications for this visit.     Allergies: No Known Allergies    Physical Exam:      Blood pressure (!) 169/99, pulse 84, temperature 98 F (36.7 C), temperature source Oral, resp. rate 17, height 5\' 5"  (1.651 m), weight 168 lb 8 oz (76.4 kg), SpO2 100 %.     ECOG:  0    General appearance: Comfortable appearing without any discomfort Head: Normocephalic without any trauma Oropharynx: Mucous membranes are moist and pink without any thrush or ulcers. Eyes: Pupils are equal and round reactive to light. Lymph nodes: No cervical, supraclavicular, inguinal or axillary lymphadenopathy.   Heart:regular rate and rhythm.  S1 and S2 without leg edema. Lung: Clear without any rhonchi or wheezes.  No dullness to percussion. Abdomin: Soft, nontender, nondistended with good bowel sounds.  No hepatosplenomegaly. Musculoskeletal: No joint deformity or effusion.  Full range of motion noted. Neurological: No deficits noted on motor, sensory and deep tendon reflex exam. Skin: No petechial rash or dryness.  Appeared moist.               Lab Results: Lab Results  Component Value Date   WBC 7.5 11/03/2020   HGB 15.1 11/03/2020   HCT 40.6 11/03/2020   MCV 91.2 11/03/2020   PLT 247 11/03/2020     Chemistry  Component Value Date/Time   NA 140 11/03/2020 1242   K 3.7 11/03/2020 1242   CL 107 11/03/2020 1242   CO2 21 (L) 11/03/2020 1242   BUN 13 11/03/2020 1242   CREATININE 1.30 (H) 11/03/2020 1242      Component Value Date/Time   CALCIUM 9.3 11/03/2020 1242   ALKPHOS 102 11/03/2020 1242   AST 57 (H) 11/03/2020 1242   ALT 92 (H) 11/03/2020 1242   BILITOT 0.4 11/03/2020 1242         IMPRESSION: 1. Right upper lobe metastasis may be minimally larger. Other pulmonary nodules are unchanged. 2. Left hepatic lobe lesion discussed on 09/01/2020 is  not readily appreciated. 3. Tiny residual low-density lesion in the right nephrectomy bed, similar to minimally decreased in size from 09/01/2020, at the site of previously seen metastasis on 05/26/2020. 4. Hepatic steatosis.      Impression and Plan:   40 year old with:  1.  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  CT scan obtained on 11/29/2020 was personally reviewed which showed no evidence of progression of disease.  Pulmonary nodules remain overall stable and hepatic lesions are no longer visible.  Risks and benefits of continuing this treatment long-term were discussed.  Potential complication including GI toxicity, autoimmune mediated issues among others were reviewed.  At the time being is agreeable to continue.    2.  Immune mediated complications: No complications noted including pneumonitis, colitis and thyroid disease.  We will continue to monitor.  3.  IV access: No issues related to peripheral veins at this time.  4.  Antiemetics: No nausea or vomiting reported at this time.  Compazine is available to him.  5.  Goals of care and prognosis discussion: Therapy remains palliative although aggressive measures are warranted given his pulm status.  6.   Hand-foot syndrome: Resolved after dose reduction so far Cabometyx.  7.  Elevated liver function test: Continue to fluctuate but overall stable and will continue to monitor.  This could be related to Cabometyx.   8.  Follow-up: In 4 weeks for repeat follow-up.   30  minutes were spent on this visit.  The time was dedicated to reviewing imaging studies, treatment choices and addressing complications related to cancer and cancer therapy.   Zola Button, MD 9/29/202212:39 PM

## 2020-12-01 NOTE — Patient Instructions (Signed)
[facility]  ? Discharge Instructions: ?Thank you for choosing [facility] to provide your oncology and hematology care.  ? ?If you have a lab appointment with the Cancer Center, please go directly to the Cancer Center and check in at the registration area. ?  ?Wear comfortable clothing and clothing appropriate for easy access to any Portacath or PICC line.  ? ?We strive to give you quality time with your provider. You may need to reschedule your appointment if you arrive late (15 or more minutes).  Arriving late affects you and other patients whose appointments are after yours.  Also, if you miss three or more appointments without notifying the office, you may be dismissed from the clinic at the provider?s discretion.    ?  ?For prescription refill requests, have your pharmacy contact our office and allow 72 hours for refills to be completed.   ? ?Today you received the following chemotherapy and/or immunotherapy agents: nivolumab    ?  ?To help prevent nausea and vomiting after your treatment, we encourage you to take your nausea medication as directed. ? ?BELOW ARE SYMPTOMS THAT SHOULD BE REPORTED IMMEDIATELY: ?*FEVER GREATER THAN 100.4 F (38 ?C) OR HIGHER ?*CHILLS OR SWEATING ?*NAUSEA AND VOMITING THAT IS NOT CONTROLLED WITH YOUR NAUSEA MEDICATION ?*UNUSUAL SHORTNESS OF BREATH ?*UNUSUAL BRUISING OR BLEEDING ?*URINARY PROBLEMS (pain or burning when urinating, or frequent urination) ?*BOWEL PROBLEMS (unusual diarrhea, constipation, pain near the anus) ?TENDERNESS IN MOUTH AND THROAT WITH OR WITHOUT PRESENCE OF ULCERS (sore throat, sores in mouth, or a toothache) ?UNUSUAL RASH, SWELLING OR PAIN  ?UNUSUAL VAGINAL DISCHARGE OR ITCHING  ? ?Items with * indicate a potential emergency and should be followed up as soon as possible or go to the Emergency Department if any problems should occur. ? ?Please show the CHEMOTHERAPY ALERT CARD or IMMUNOTHERAPY ALERT CARD at check-in  to the Emergency Department and triage nurse. ? ?Should you have questions after your visit or need to cancel or reschedule your appointment, please contact [facility]  Dept: [phone]  and follow the prompts.  Office hours are 8:00 a.m. to 4:30 p.m. Monday - Friday. Please note that voicemails left after 4:00 p.m. may not be returned until the following business day.  We are closed weekends and major holidays. You have access to a nurse at all times for urgent questions. Please call the main number to the clinic Dept: [phone] and follow the prompts. ? ? ?For any non-urgent questions, you may also contact your provider using MyChart. We now offer e-Visits for anyone 18 and older to request care online for non-urgent symptoms. For details visit [email or web address]. ?  ?Also download the MyChart app! Go to the app store, search "MyChart", open the app, select , and log in with your MyChart username and password. ? ?Due to Covid, a mask is required upon entering the hospital/clinic. If you do not have a mask, one will be given to you upon arrival. For doctor visits, patients may have 1 support person aged 18 or older with them. For treatment visits, patients cannot have anyone with them due to current Covid guidelines and our immunocompromised population.  ? ?

## 2020-12-16 ENCOUNTER — Telehealth: Payer: Self-pay | Admitting: Oncology

## 2020-12-16 NOTE — Telephone Encounter (Signed)
Called patient regarding upcoming appointments, patient's phone number seems to be out of service or incorrect. Calender will be mailed.

## 2020-12-18 ENCOUNTER — Other Ambulatory Visit: Payer: Self-pay | Admitting: Oncology

## 2020-12-19 ENCOUNTER — Telehealth: Payer: Self-pay | Admitting: *Deleted

## 2020-12-19 ENCOUNTER — Encounter: Payer: Self-pay | Admitting: Oncology

## 2020-12-19 NOTE — Telephone Encounter (Signed)
Patient called to question if he has clearance for dental treatment.  He states he has a implant that has come loose and he will see the dentist next Monday.  He wants to know is there any contradictions to him getting any type of treatment for his tooth with his treatment from an oncologic standpoint.    Routing to Dr Alen Blew.

## 2020-12-22 ENCOUNTER — Other Ambulatory Visit (HOSPITAL_COMMUNITY): Payer: Self-pay

## 2020-12-29 ENCOUNTER — Inpatient Hospital Stay: Payer: Managed Care, Other (non HMO) | Attending: Oncology

## 2020-12-29 ENCOUNTER — Inpatient Hospital Stay (HOSPITAL_BASED_OUTPATIENT_CLINIC_OR_DEPARTMENT_OTHER): Payer: Managed Care, Other (non HMO) | Admitting: Oncology

## 2020-12-29 ENCOUNTER — Other Ambulatory Visit: Payer: Self-pay

## 2020-12-29 ENCOUNTER — Inpatient Hospital Stay: Payer: Managed Care, Other (non HMO)

## 2020-12-29 VITALS — BP 141/91 | HR 81

## 2020-12-29 VITALS — BP 139/93 | HR 70 | Temp 97.4°F | Resp 17 | Wt 167.3 lb

## 2020-12-29 DIAGNOSIS — C7802 Secondary malignant neoplasm of left lung: Secondary | ICD-10-CM | POA: Diagnosis present

## 2020-12-29 DIAGNOSIS — C641 Malignant neoplasm of right kidney, except renal pelvis: Secondary | ICD-10-CM | POA: Diagnosis not present

## 2020-12-29 DIAGNOSIS — Z5112 Encounter for antineoplastic immunotherapy: Secondary | ICD-10-CM | POA: Insufficient documentation

## 2020-12-29 DIAGNOSIS — C649 Malignant neoplasm of unspecified kidney, except renal pelvis: Secondary | ICD-10-CM | POA: Diagnosis present

## 2020-12-29 DIAGNOSIS — Z79899 Other long term (current) drug therapy: Secondary | ICD-10-CM | POA: Insufficient documentation

## 2020-12-29 DIAGNOSIS — R7989 Other specified abnormal findings of blood chemistry: Secondary | ICD-10-CM | POA: Insufficient documentation

## 2020-12-29 LAB — CBC WITH DIFFERENTIAL (CANCER CENTER ONLY)
Abs Immature Granulocytes: 0.01 10*3/uL (ref 0.00–0.07)
Basophils Absolute: 0 10*3/uL (ref 0.0–0.1)
Basophils Relative: 1 %
Eosinophils Absolute: 0.5 10*3/uL (ref 0.0–0.5)
Eosinophils Relative: 8 %
HCT: 43.8 % (ref 39.0–52.0)
Hemoglobin: 15.8 g/dL (ref 13.0–17.0)
Immature Granulocytes: 0 %
Lymphocytes Relative: 46 %
Lymphs Abs: 3 10*3/uL (ref 0.7–4.0)
MCH: 33.4 pg (ref 26.0–34.0)
MCHC: 36.1 g/dL — ABNORMAL HIGH (ref 30.0–36.0)
MCV: 92.6 fL (ref 80.0–100.0)
Monocytes Absolute: 0.4 10*3/uL (ref 0.1–1.0)
Monocytes Relative: 7 %
Neutro Abs: 2.5 10*3/uL (ref 1.7–7.7)
Neutrophils Relative %: 38 %
Platelet Count: 256 10*3/uL (ref 150–400)
RBC: 4.73 MIL/uL (ref 4.22–5.81)
RDW: 12 % (ref 11.5–15.5)
WBC Count: 6.5 10*3/uL (ref 4.0–10.5)
nRBC: 0 % (ref 0.0–0.2)

## 2020-12-29 LAB — CMP (CANCER CENTER ONLY)
ALT: 97 U/L — ABNORMAL HIGH (ref 0–44)
AST: 73 U/L — ABNORMAL HIGH (ref 15–41)
Albumin: 4 g/dL (ref 3.5–5.0)
Alkaline Phosphatase: 96 U/L (ref 38–126)
Anion gap: 12 (ref 5–15)
BUN: 12 mg/dL (ref 6–20)
CO2: 22 mmol/L (ref 22–32)
Calcium: 9.4 mg/dL (ref 8.9–10.3)
Chloride: 108 mmol/L (ref 98–111)
Creatinine: 1.46 mg/dL — ABNORMAL HIGH (ref 0.61–1.24)
GFR, Estimated: >60 mL/min (ref >60)
Glucose, Bld: 142 mg/dL — ABNORMAL HIGH (ref 70–99)
Potassium: 3.9 mmol/L (ref 3.5–5.1)
Sodium: 142 mmol/L (ref 135–145)
Total Bilirubin: 0.6 mg/dL (ref 0.3–1.2)
Total Protein: 7.7 g/dL (ref 6.5–8.1)

## 2020-12-29 LAB — TSH: TSH: 3.843 u[IU]/mL (ref 0.320–4.118)

## 2020-12-29 MED ORDER — SODIUM CHLORIDE 0.9% FLUSH
10.0000 mL | INTRAVENOUS | Status: DC | PRN
  Administered 2020-12-29: 10 mL

## 2020-12-29 MED ORDER — SODIUM CHLORIDE 0.9 % IV SOLN: Freq: Once | INTRAVENOUS | Status: AC

## 2020-12-29 MED ORDER — SODIUM CHLORIDE 0.9 % IV SOLN
480.0000 mg | Freq: Once | INTRAVENOUS | Status: AC
  Administered 2020-12-29: 480 mg via INTRAVENOUS
  Filled 2020-12-29: qty 48

## 2020-12-29 NOTE — Patient Instructions (Signed)
[facility]  Discharge Instructions: ?Thank you for choosing [facility] to provide your oncology and hematology care.  ? ?If you have a lab appointment with the Cancer Center, please go directly to the Cancer Center and check in at the registration area. ?  ?Wear comfortable clothing and clothing appropriate for easy access to any Portacath or PICC line.  ? ?We strive to give you quality time with your provider. You may need to reschedule your appointment if you arrive late (15 or more minutes).  Arriving late affects you and other patients whose appointments are after yours.  Also, if you miss three or more appointments without notifying the office, you may be dismissed from the clinic at the provider?s discretion.    ?  ?For prescription refill requests, have your pharmacy contact our office and allow 72 hours for refills to be completed.   ? ?Today you received the following chemotherapy and/or immunotherapy agents Opdivo    ?  ?To help prevent nausea and vomiting after your treatment, we encourage you to take your nausea medication as directed. ? ?BELOW ARE SYMPTOMS THAT SHOULD BE REPORTED IMMEDIATELY: ?*FEVER GREATER THAN 100.4 F (38 ?C) OR HIGHER ?*CHILLS OR SWEATING ?*NAUSEA AND VOMITING THAT IS NOT CONTROLLED WITH YOUR NAUSEA MEDICATION ?*UNUSUAL SHORTNESS OF BREATH ?*UNUSUAL BRUISING OR BLEEDING ?*URINARY PROBLEMS (pain or burning when urinating, or frequent urination) ?*BOWEL PROBLEMS (unusual diarrhea, constipation, pain near the anus) ?TENDERNESS IN MOUTH AND THROAT WITH OR WITHOUT PRESENCE OF ULCERS (sore throat, sores in mouth, or a toothache) ?UNUSUAL RASH, SWELLING OR PAIN  ?UNUSUAL VAGINAL DISCHARGE OR ITCHING  ? ?Items with * indicate a potential emergency and should be followed up as soon as possible or go to the Emergency Department if any problems should occur. ? ?Please show the CHEMOTHERAPY ALERT CARD or IMMUNOTHERAPY ALERT CARD at check-in to the  Emergency Department and triage nurse. ? ?Should you have questions after your visit or need to cancel or reschedule your appointment, please contact [facility]  Dept: [phone]  and follow the prompts.  Office hours are 8:00 a.m. to 4:30 p.m. Monday - Friday. Please note that voicemails left after 4:00 p.m. may not be returned until the following business day.  We are closed weekends and major holidays. You have access to a nurse at all times for urgent questions. Please call the main number to the clinic Dept: [phone] and follow the prompts. ? ? ?For any non-urgent questions, you may also contact your provider using MyChart. We now offer e-Visits for anyone 18 and older to request care online for non-urgent symptoms. For details visit [email or web address]. ?  ?Also download the MyChart app! Go to the app store, search "MyChart", open the app, select [facility], and log in with your MyChart username and password. ? ?Due to Covid, a mask is required upon entering the hospital/clinic. If you do not have a mask, one will be given to you upon arrival. For doctor visits, patients may have 1 support person aged 18 or older with them. For treatment visits, patients cannot have anyone with them due to current Covid guidelines and our immunocompromised population.  ? ?

## 2020-12-29 NOTE — Progress Notes (Signed)
Hematology and Oncology Follow Up Visit  Gordon Thomas [id number] [birth year] 40 y.o. 12/29/2020 8:35 AM Gordon Thomas, MDShelton, Gordon Millin, MD   Principle Diagnosis: 40 year old man with kidney cancer diagnosed in 2021.  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  Prior Therapy: He is status post right radical nephrectomy completed on June 12, 2019.  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  Ipilimumab 1 mg/kg and nivolumab 3 mg/kg given every 3 weeks. Cycle 1 given on March 01, 2020. He completed 4 cycles of therapy on May 04, 2020.  Current therapy: Nivolumab 480 mg every 4 weeks to start on March 30th 2022.  Cabozantinib 40 mg started on June 10, 2020.   His dose was reduced to 20 mg in May 2022.  He is here for the next cycle of therapy.  Interim History: [name] presents today for a follow-up visit.  Since the last visit, he reports no major changes in his health.  He is having dental issues and requiring to take antibiotics for a dental abscess.  He denies any fevers, chills or sweats.  He denies any worsening diarrhea or hand-foot syndrome.  His performance status quality of life remain excellent.  He has tolerated Cabometyx without any new complaints.     Medications: Updated on review. Current Outpatient Medications  Medication Sig Dispense Refill   albuterol (VENTOLIN HFA) 108 (90 Base) MCG/ACT inhaler Inhale 1-2 puffs into the lungs every 6 (six) hours as needed for wheezing or shortness of breath.     CABOMETYX 20 MG tablet TAKE 1 TABLET DAILY ON AN EMPTY STOMACH, 1 HOUR BEFORE OR 2 HOURS AFTER A MEAL 30 tablet 0   docusate sodium (COLACE) 100 MG capsule Take 1 capsule (100 mg total) by mouth 2 (two) times daily.     prochlorperazine (COMPAZINE) 10 MG tablet Take 1 tablet (10 mg total) by mouth every 6 (six) hours as needed for nausea or vomiting. 30 tablet 0    promethazine (PHENERGAN) 12.5 MG tablet Take 1 tablet (12.5 mg total) by mouth every 4 (four) hours as needed for nausea or vomiting. 15 tablet 0   No current facility-administered medications for this visit.     Allergies: No Known Allergies    Physical Exam:       Blood pressure (!) 139/93, pulse 70, temperature (!) 97.4 F (36.3 C), temperature source Tympanic, resp. rate 17, weight 167 lb 5 oz (75.9 kg), SpO2 98 %.     ECOG:  0     General appearance: Alert, awake without any distress. Head: Atraumatic without abnormalities Oropharynx: Without any thrush or ulcers. Eyes: No scleral icterus. Lymph nodes: No lymphadenopathy noted in the cervical, supraclavicular, or axillary nodes Heart:regular rate and rhythm, without any murmurs or gallops.   Lung: Clear to auscultation without any rhonchi, wheezes or dullness to percussion. Abdomin: Soft, nontender without any shifting dullness or ascites. Musculoskeletal: No clubbing or cyanosis. Neurological: No motor or sensory deficits. Skin: No rashes or lesions.               Lab Results: Lab Results  Component Value Date   WBC 7.5 12/01/2020   HGB 15.9 12/01/2020   HCT 43.8 12/01/2020   MCV 92.4 12/01/2020   PLT 258 12/01/2020     Chemistry      Component Value Date/Time   NA 142 12/01/2020 1229   K 3.6 12/01/2020 1229   CL  106 12/01/2020 1229   CO2 24 12/01/2020 1229   BUN 15 12/01/2020 1229   CREATININE 1.50 (H) 12/01/2020 1229      Component Value Date/Time   CALCIUM 9.4 12/01/2020 1229   ALKPHOS 80 12/01/2020 1229   AST 48 (H) 12/01/2020 1229   ALT 82 (H) 12/01/2020 1229   BILITOT 0.5 12/01/2020 1229           Impression and Plan:   76 year old with:  1.  Kidney cancer diagnosed in 2021.  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  He continues to tolerate the current treatment without any major complaints at this time.  Risks and benefits of  continuing this treatment versus alternative treatment options were reviewed.  Complications include GI toxicity and autoimmune issues were reiterated.  He is agreeable to continue at this time and the plan to update his staging scan before end of December.  Different salvage therapy options will be used if he has progression of disease after the next scan.   2.  Immune mediated complications: I continue to educate him about potential complication including pneumonitis, colitis and thyroid disease.  3.  IV access: Peripheral veins are currently in use without any issues.  4.  Antiemetics: Compazine is available to him without any nausea or vomiting.  5.  Goals of care and prognosis discussion: His disease is incurable although aggressive measures are warranted given his reasonable pulm status.  6.   Hand-foot syndrome: Resolved at this time we will continue to monitor on Cabometyx.  7.  Elevated liver function test: Continues to be mild and asymptomatic and likely related to Cabometyx.  We will continue to monitor without any treatment to delay.  8.  Dental issues: I see no objections to continue to have dental intervention based on the current therapy he is on.   9.  Follow-up: He will return next months for the next cycle of therapy.   30  minutes were dedicated to this encounter.  The time was spent on reviewing his disease status, treatment choices and future plan of care discussion.   Zola Button, MD 10/27/20228:35 AM

## 2021-01-13 ENCOUNTER — Other Ambulatory Visit: Payer: Self-pay | Admitting: Oncology

## 2021-01-13 DIAGNOSIS — C641 Malignant neoplasm of right kidney, except renal pelvis: Secondary | ICD-10-CM

## 2021-01-25 ENCOUNTER — Telehealth: Payer: Self-pay

## 2021-01-25 NOTE — Telephone Encounter (Signed)
Pt has COVID. Symptoms began on 01/23/21. Will send a message to scheduling to push out pt's next appointment and treatment until 02/14/21 or after. Pt acknowledged and verbalized understanding.

## 2021-01-27 ENCOUNTER — Telehealth: Payer: Self-pay | Admitting: Oncology

## 2021-01-27 NOTE — Telephone Encounter (Signed)
Scheduled per sch msg. Called and leeft msg

## 2021-02-03 ENCOUNTER — Inpatient Hospital Stay: Payer: Managed Care, Other (non HMO)

## 2021-02-03 ENCOUNTER — Inpatient Hospital Stay: Payer: Managed Care, Other (non HMO) | Admitting: Physician Assistant

## 2021-02-06 ENCOUNTER — Encounter: Payer: Self-pay | Admitting: Oncology

## 2021-02-06 NOTE — Progress Notes (Signed)
Received forwarded message from RN from patient regarding insurance changes and assistance with his Carbometyx.  Forwarded message to [employer] in pharmacy.  If patient has additional questions, please have him contact me at [phone].

## 2021-02-07 ENCOUNTER — Telehealth: Payer: Self-pay | Admitting: Pharmacist

## 2021-02-07 ENCOUNTER — Other Ambulatory Visit: Payer: Self-pay | Admitting: Oncology

## 2021-02-07 DIAGNOSIS — C641 Malignant neoplasm of right kidney, except renal pelvis: Secondary | ICD-10-CM

## 2021-02-07 NOTE — Telephone Encounter (Signed)
Oral Chemotherapy Pharmacist Encounter   Attempted to reach patient to follow up regarding patient inquiry about his insurance changing for 2023 and assistance needed regarding ensuring no lapse in Cabometyx coverage for upcoming year.   No answer. Patient does not have voicemail set up, so unable to leave voicemail for call back.   Leron Croak, PharmD, BCPS Hematology/Oncology Clinical Pharmacist [facility] [phone] 02/07/2021 10:01 AM

## 2021-02-08 NOTE — Progress Notes (Signed)
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  PRIOR THERAPY: 1) He is status post right radical nephrectomy completed on June 12, 2019.  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 2) Ipilimumab 1 mg/kg and nivolumab 3 mg/kg given every 3 weeks. Cycle 1 given on March 01, 2020. He completed 4 cycles of therapy on May 04, 2020.  CURRENT THERAPY:  Nivolumab 480 mg every 4 weeks to start on March 30th 2022.  Cabozantinib 40 mg started on June 10, 2020.   His dose was reduced to 20 mg in May 2022.  He is here for next cycle of therapy.  INTERVAL HISTORY: Gordon Thomas 41 y.o. male returns to the clinic today for a follow-up visit for his renal cell carcinoma.  The patient was last seen in the clinic on 12/29/2020.    The patient tolerated his last cycle of treatment fairly well without any major concerning adverse side effects. The patient has been having some persistent elevations in his LFTs recently which Dr. Alen Blew attributes to his Carbozantinib.  The patient denies excessive Tylenol use or any alcohol use.  He has some mild hand-foot syndrome which is manageable now that he is on a lower dose of Carbozantinib.   He is concerned that his treatment will not be covered as he is changing insurance in January 2023.  Pharmacy tried to reach out to the patient regarding his inquiry about his insurance but the patient does not have his voicemail set up.   In the interval since he was last seen, the patient was diagnosed with COVID-19.  Interestingly, the patient did not have a cough when he had COVID-19 but he recently developed a dry sporadic cough over the last few days.  He does not have any associated fever, chills, sore throat, myalgias, or  shortness of breath.  He denies any chest pain but reports that it sometimes feels like he has a irritated sensation in his chest with a deep breath.   Also in the interval, the patient notes that he has had more of an unsettling feeling in his stomach with a lot of rumbling/gurgling.  Denies any nausea or vomiting.  He notes that after he eats, he often needs to use the restroom quickly.  Denies any overt diarrhea. The consistency of the stools change which sometimes are normal and sometimes softer. He tried to take Pepto-Bismol for the unsettled feeling in his stomach and noticed that he developed dark stools after this.  He also notes that he has been having more of a metallic taste in his mouth.   He has some occasional night sweats but attributes it to a warm sleeping environment.  He continues to have a "strong" appetite.  He denies any back pain.  He denies any hematuria or dysuria.  The patient is here today for evaluation and repeat blood work before starting the next cycle of treatment      MEDICAL HISTORY: Past Medical History:  Diagnosis Date   Anxiety    Depression     ALLERGIES:  has No Known Allergies.  MEDICATIONS:  Current Outpatient Medications  Medication Sig Dispense Refill   albuterol (VENTOLIN HFA) 108 (90 Base) MCG/ACT inhaler Inhale 1-2 puffs into the lungs every 6 (six) hours as needed for wheezing  or shortness of breath.     CABOMETYX 20 MG tablet TAKE 1 TABLET DAILY ON AN EMPTY STOMACH, 1 HOUR BEFORE OR 2 HOURS AFTER A MEAL 30 tablet 0   docusate sodium (COLACE) 100 MG capsule Take 1 capsule (100 mg total) by mouth 2 (two) times daily.     prochlorperazine (COMPAZINE) 10 MG tablet Take 1 tablet (10 mg total) by mouth every 6 (six) hours as needed for nausea or vomiting. 30 tablet 0   promethazine (PHENERGAN) 12.5 MG tablet Take 1 tablet (12.5 mg total) by mouth every 4 (four) hours as needed for nausea or vomiting. 15 tablet 0   No current facility-administered  medications for this visit.   Facility-Administered Medications Ordered in Other Visits  Medication Dose Route Frequency Provider Last Rate Last Admin   nivolumab (OPDIVO) 480 mg in sodium chloride 0.9 % 100 mL chemo infusion  480 mg Intravenous Once Wyatt Portela, MD        SURGICAL HISTORY:  Past Surgical History:  Procedure Laterality Date   NO PAST SURGERIES     ROBOT ASSISTED LAPAROSCOPIC NEPHRECTOMY Right 06/12/2019   Procedure: XI ROBOTIC ASSISTED LAPAROSCOPIC NEPHRECTOMY;  Surgeon: Ceasar Mons, MD;  Location: [facility];  Service: Urology;  Laterality: Right;    REVIEW OF SYSTEMS:   Review of Systems  Constitutional: Negative for appetite change, chills, fatigue, fever and unexpected weight change.  HENT: Negative for mouth sores, nosebleeds, sore throat and trouble swallowing.   Eyes: Negative for eye problems and icterus.  Respiratory: Positive for new dry cough.  Negative for hemoptysis, shortness of breath and wheezing.   Cardiovascular: Negative for chest pain and leg swelling.  Gastrointestinal: Positive for dark stools.  Negative for abdominal pain, constipation, diarrhea, nausea and vomiting.  Genitourinary: Negative for bladder incontinence, difficulty urinating, dysuria, frequency and hematuria.   Musculoskeletal: Negative for back pain, gait problem, neck pain and neck stiffness.  Skin: Negative for itching and rash.  Neurological: Negative for dizziness, extremity weakness, gait problem, headaches, light-headedness and seizures.  Hematological: Negative for adenopathy. Does not bruise/bleed easily.  Psychiatric/Behavioral: Negative for confusion, depression and sleep disturbance. The patient is not nervous/anxious.     PHYSICAL EXAMINATION:  Blood pressure 139/88, pulse (!) 104, temperature (!) 97.3 F (36.3 C), temperature source Temporal, resp. rate 16, weight 168 lb (76.2 kg), SpO2 99 %.  ECOG PERFORMANCE STATUS: 1  Physical Exam  Constitutional:  Oriented to person, place, and time and well-developed, well-nourished, and in no distress.  HENT:  Head: Normocephalic and atraumatic.  Mouth/Throat: Oropharynx is clear and moist. No oropharyngeal exudate.  Eyes: Conjunctivae are normal. Right eye exhibits no discharge. Left eye exhibits no discharge. No scleral icterus.  Neck: Normal range of motion. Neck supple.  Cardiovascular: Normal rate, regular rhythm, normal heart sounds and intact distal pulses.   Pulmonary/Chest: Effort normal and breath sounds normal. No respiratory distress. No wheezes. No rales.  Abdominal: Soft. Bowel sounds are normal. Exhibits no distension and no mass. There is no tenderness.  Musculoskeletal: Normal range of motion. Exhibits no edema.  Lymphadenopathy:    No cervical adenopathy.  Neurological: Alert and oriented to person, place, and time. Exhibits normal muscle tone. Gait normal. Coordination normal.  Skin: Skin is warm and dry. No rash noted. Not diaphoretic. No erythema. No pallor.  Psychiatric: Mood, memory and judgment normal.  Vitals reviewed.  LABORATORY DATA: Lab Results  Component Value Date   WBC 9.1 02/14/2021   HGB 15.8 02/14/2021  HCT 43.9 02/14/2021   MCV 91.3 02/14/2021   PLT 299 02/14/2021      Chemistry      Component Value Date/Time   NA 143 02/14/2021 1414   K 3.9 02/14/2021 1414   CL 109 02/14/2021 1414   CO2 25 02/14/2021 1414   BUN 9 02/14/2021 1414   CREATININE 1.31 (H) 02/14/2021 1414      Component Value Date/Time   CALCIUM 9.0 02/14/2021 1414   ALKPHOS 86 02/14/2021 1414   AST 40 02/14/2021 1414   ALT 63 (H) 02/14/2021 1414   BILITOT 0.4 02/14/2021 1414       RADIOGRAPHIC STUDIES:  No results found.   ASSESSMENT/PLAN:  40 year old with:   1.  Kidney cancer diagnosed in 2021.  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   He continues to tolerate the current treatment without any major complaints at this time. He is  agreeable to continue at this time and the plan to update his staging scan before end of December which is scheduled for 02/28/21.  I have reached out to pharmacy about his concern with his insurance changing. They will go speak to him while in infusion today about Cabometyx with his new insurance.   2.  Immune mediated complications: continue to educate him about potential complication including pneumonitis, colitis and thyroid disease.   3.  IV access: Peripheral veins are currently in use without any issues.   4.  Antiemetics: Compazine is available to him without any nausea or vomiting.   5.  Goals of care and prognosis discussion: His disease is incurable although aggressive measures are warranted given his reasonable pulm status.   6.   Hand-foot syndrome: Resolved at this time we will continue to monitor on Cabometyx.   7.  Elevated liver function test: Continues to be mild and asymptomatic and Dr. Alen Blew feels likely related to Cabometyx.  We will continue to monitor without any treatment to delay.  Labs are stable to slightly improved today.    8.  Dental issues: Per Dr. Hazeline Junker last note: "I see no objections to continue to have dental intervention based on the current therapy he is on".  9. Cough: Patient diagnosed with COVID-19 in November 2022.  The patient, interestingly, developed a cough in the last few days.  Does not have any fevers, chills, sore throat, shortness of breath, or myalgias.  Patient is well-appearing today and his oxygen is 99%. This may be residual due to COVID-19. For his malignancy, he is scheduled for repeat CT in 2 weeks.  We will arrange for the patient to have a chest x-ray performed today just to rule out secondary infection/pneumonia but will further visualize his response to treatment on upcoming restaging CT scan.   10.  Metallic taste, dyspepsia, etc: Patient is reporting an unsettling feeling in his stomach/rumbling.  Reviewed with him that Cabometyx  may cause dyspepsia.  Advised him to start taking PPI to see if it helps his symptoms. Unclear if metallic taste in mouth related to reflux. Reviewed for drug interactions and with pharmacy with cabometryx and PPI. Appears safe to use PPI with cabometryx.   11.  Dark stool: The patient's hemoglobin is stable today.  I have low suspicion for GI bleed and is likely that his dark stool was due to his Pepto-Bismol use as this occurred while/after he was taking Pepto-Bismol.      9.  Follow-up: He will return next months for the next  cycle of therapy.   Orders Placed This Encounter  Procedures   DG Chest 2 View    Standing Status:   Future    Standing Expiration Date:   02/14/2022    Order Specific Question:   Reason for Exam (SYMPTOM  OR DIAGNOSIS REQUIRED)    Answer:   Had covid few weeks ago but complaining of worsening cough. Rule out pneumonia. Also has renal cell cancer    Order Specific Question:   Preferred imaging location?    Answer:   [facility]     The total time spent in the appointment was 20-29 minutes.   Ikhlas Albo L Taelon Bendorf, PA-C 02/14/21

## 2021-02-14 ENCOUNTER — Inpatient Hospital Stay: Payer: Managed Care, Other (non HMO)

## 2021-02-14 ENCOUNTER — Telehealth: Payer: Self-pay

## 2021-02-14 ENCOUNTER — Inpatient Hospital Stay (HOSPITAL_BASED_OUTPATIENT_CLINIC_OR_DEPARTMENT_OTHER): Payer: Managed Care, Other (non HMO) | Admitting: Physician Assistant

## 2021-02-14 ENCOUNTER — Ambulatory Visit (HOSPITAL_COMMUNITY)
Admission: RE | Admit: 2021-02-14 | Discharge: 2021-02-14 | Disposition: A | Discharge status: Home / Self Care | Payer: Managed Care, Other (non HMO) | Source: Ambulatory Visit | Attending: Physician Assistant | Admitting: Physician Assistant

## 2021-02-14 ENCOUNTER — Inpatient Hospital Stay: Payer: Managed Care, Other (non HMO) | Attending: Oncology

## 2021-02-14 ENCOUNTER — Other Ambulatory Visit: Payer: Self-pay

## 2021-02-14 VITALS — HR 93

## 2021-02-14 VITALS — BP 139/88 | HR 104 | Temp 97.3°F | Resp 16 | Wt 168.0 lb

## 2021-02-14 DIAGNOSIS — Z79899 Other long term (current) drug therapy: Secondary | ICD-10-CM | POA: Insufficient documentation

## 2021-02-14 DIAGNOSIS — K219 Gastro-esophageal reflux disease without esophagitis: Secondary | ICD-10-CM | POA: Diagnosis not present

## 2021-02-14 DIAGNOSIS — R051 Acute cough: Secondary | ICD-10-CM | POA: Insufficient documentation

## 2021-02-14 DIAGNOSIS — C649 Malignant neoplasm of unspecified kidney, except renal pelvis: Secondary | ICD-10-CM | POA: Insufficient documentation

## 2021-02-14 DIAGNOSIS — Z5112 Encounter for antineoplastic immunotherapy: Secondary | ICD-10-CM | POA: Diagnosis present

## 2021-02-14 DIAGNOSIS — Z8616 Personal history of COVID-19: Secondary | ICD-10-CM | POA: Insufficient documentation

## 2021-02-14 DIAGNOSIS — R7989 Other specified abnormal findings of blood chemistry: Secondary | ICD-10-CM | POA: Diagnosis not present

## 2021-02-14 DIAGNOSIS — C641 Malignant neoplasm of right kidney, except renal pelvis: Secondary | ICD-10-CM | POA: Diagnosis not present

## 2021-02-14 DIAGNOSIS — R059 Cough, unspecified: Secondary | ICD-10-CM | POA: Insufficient documentation

## 2021-02-14 DIAGNOSIS — Z905 Acquired absence of kidney: Secondary | ICD-10-CM | POA: Diagnosis not present

## 2021-02-14 DIAGNOSIS — C7802 Secondary malignant neoplasm of left lung: Secondary | ICD-10-CM | POA: Insufficient documentation

## 2021-02-14 LAB — CMP (CANCER CENTER ONLY)
ALT: 63 U/L — ABNORMAL HIGH (ref 0–44)
AST: 40 U/L (ref 15–41)
Albumin: 4 g/dL (ref 3.5–5.0)
Alkaline Phosphatase: 86 U/L (ref 38–126)
Anion gap: 9 (ref 5–15)
BUN: 9 mg/dL (ref 6–20)
CO2: 25 mmol/L (ref 22–32)
Calcium: 9 mg/dL (ref 8.9–10.3)
Chloride: 109 mmol/L (ref 98–111)
Creatinine: 1.31 mg/dL — ABNORMAL HIGH (ref 0.61–1.24)
GFR, Estimated: >60 mL/min (ref >60)
Glucose, Bld: 82 mg/dL (ref 70–99)
Potassium: 3.9 mmol/L (ref 3.5–5.1)
Sodium: 143 mmol/L (ref 135–145)
Total Bilirubin: 0.4 mg/dL (ref 0.3–1.2)
Total Protein: 7.7 g/dL (ref 6.5–8.1)

## 2021-02-14 LAB — CBC WITH DIFFERENTIAL (CANCER CENTER ONLY)
Abs Immature Granulocytes: 0.01 10*3/uL (ref 0.00–0.07)
Basophils Absolute: 0 10*3/uL (ref 0.0–0.1)
Basophils Relative: 0 %
Eosinophils Absolute: 0.5 10*3/uL (ref 0.0–0.5)
Eosinophils Relative: 5 %
HCT: 43.9 % (ref 39.0–52.0)
Hemoglobin: 15.8 g/dL (ref 13.0–17.0)
Immature Granulocytes: 0 %
Lymphocytes Relative: 47 %
Lymphs Abs: 4.2 10*3/uL — ABNORMAL HIGH (ref 0.7–4.0)
MCH: 32.8 pg (ref 26.0–34.0)
MCHC: 36 g/dL (ref 30.0–36.0)
MCV: 91.3 fL (ref 80.0–100.0)
Monocytes Absolute: 0.7 10*3/uL (ref 0.1–1.0)
Monocytes Relative: 7 %
Neutro Abs: 3.7 10*3/uL (ref 1.7–7.7)
Neutrophils Relative %: 41 %
Platelet Count: 299 10*3/uL (ref 150–400)
RBC: 4.81 MIL/uL (ref 4.22–5.81)
RDW: 12.7 % (ref 11.5–15.5)
WBC Count: 9.1 10*3/uL (ref 4.0–10.5)
nRBC: 0 % (ref 0.0–0.2)

## 2021-02-14 LAB — TSH: TSH: 6.164 u[IU]/mL — ABNORMAL HIGH (ref 0.320–4.118)

## 2021-02-14 MED ORDER — SODIUM CHLORIDE 0.9 % IV SOLN: Freq: Once | INTRAVENOUS | Status: AC

## 2021-02-14 MED ORDER — SODIUM CHLORIDE 0.9 % IV SOLN
480.0000 mg | Freq: Once | INTRAVENOUS | Status: AC
  Administered 2021-02-14: 480 mg via INTRAVENOUS
  Filled 2021-02-14: qty 48

## 2021-02-14 NOTE — Patient Instructions (Signed)
[facility] MEDICAL ONCOLOGY  ? Discharge Instructions: ?Thank you for choosing [facility] to provide your oncology and hematology care.  ? ?If you have a lab appointment with the Cancer Center, please go directly to the Cancer Center and check in at the registration area. ?  ?Wear comfortable clothing and clothing appropriate for easy access to any Portacath or PICC line.  ? ?We strive to give you quality time with your provider. You may need to reschedule your appointment if you arrive late (15 or more minutes).  Arriving late affects you and other patients whose appointments are after yours.  Also, if you miss three or more appointments without notifying the office, you may be dismissed from the clinic at the provider?s discretion.    ?  ?For prescription refill requests, have your pharmacy contact our office and allow 72 hours for refills to be completed.   ? ?Today you received the following chemotherapy and/or immunotherapy agents: nivolumab    ?  ?To help prevent nausea and vomiting after your treatment, we encourage you to take your nausea medication as directed. ? ?BELOW ARE SYMPTOMS THAT SHOULD BE REPORTED IMMEDIATELY: ?*FEVER GREATER THAN 100.4 F (38 ?C) OR HIGHER ?*CHILLS OR SWEATING ?*NAUSEA AND VOMITING THAT IS NOT CONTROLLED WITH YOUR NAUSEA MEDICATION ?*UNUSUAL SHORTNESS OF BREATH ?*UNUSUAL BRUISING OR BLEEDING ?*URINARY PROBLEMS (pain or burning when urinating, or frequent urination) ?*BOWEL PROBLEMS (unusual diarrhea, constipation, pain near the anus) ?TENDERNESS IN MOUTH AND THROAT WITH OR WITHOUT PRESENCE OF ULCERS (sore throat, sores in mouth, or a toothache) ?UNUSUAL RASH, SWELLING OR PAIN  ?UNUSUAL VAGINAL DISCHARGE OR ITCHING  ? ?Items with * indicate a potential emergency and should be followed up as soon as possible or go to the Emergency Department if any problems should occur. ? ?Please show the CHEMOTHERAPY ALERT CARD or IMMUNOTHERAPY ALERT CARD at check-in  to the Emergency Department and triage nurse. ? ?Should you have questions after your visit or need to cancel or reschedule your appointment, please contact [facility]  Dept: [phone]  and follow the prompts.  Office hours are 8:00 a.m. to 4:30 p.m. Monday - Friday. Please note that voicemails left after 4:00 p.m. may not be returned until the following business day.  We are closed weekends and major holidays. You have access to a nurse at all times for urgent questions. Please call the main number to the clinic Dept: [phone] and follow the prompts. ? ? ?For any non-urgent questions, you may also contact your provider using MyChart. We now offer e-Visits for anyone 18 and older to request care online for non-urgent symptoms. For details visit [email or web address]. ?  ?Also download the MyChart app! Go to the app store, search "MyChart", open the app, select , and log in with your MyChart username and password. ? ?Due to Covid, a mask is required upon entering the hospital/clinic. If you do not have a mask, one will be given to you upon arrival. For doctor visits, patients may have 1 support person aged 18 or older with them. For treatment visits, patients cannot have anyone with them due to current Covid guidelines and our immunocompromised population.  ? ?

## 2021-02-14 NOTE — Telephone Encounter (Signed)
Oral Chemotherapy Pharmacist Encounter   Spoke to patient regarding insurance changes. Patient is switching to [facility] and when speaking with a representative they cover 80% of the medication. Discussed with patients that there are options including patient assistance forms which we can explore and gave patient my number to contact me with how many pills the patient still has so we know how much time we have to work on it.   Oral chemotherapy will continue to follow.   Drema Halon, PharmD Hematology/Oncology Clinical Pharmacist [facility] [phone]

## 2021-02-15 ENCOUNTER — Encounter: Payer: Self-pay | Admitting: Physician Assistant

## 2021-02-28 ENCOUNTER — Other Ambulatory Visit: Payer: Self-pay

## 2021-02-28 ENCOUNTER — Ambulatory Visit (HOSPITAL_COMMUNITY)
Admission: RE | Admit: 2021-02-28 | Discharge: 2021-02-28 | Disposition: A | Discharge status: Home / Self Care | Payer: Managed Care, Other (non HMO) | Source: Ambulatory Visit | Attending: Oncology | Admitting: Oncology

## 2021-02-28 ENCOUNTER — Encounter (HOSPITAL_COMMUNITY): Payer: Self-pay

## 2021-02-28 DIAGNOSIS — C641 Malignant neoplasm of right kidney, except renal pelvis: Secondary | ICD-10-CM | POA: Diagnosis present

## 2021-03-03 ENCOUNTER — Ambulatory Visit: Payer: Managed Care, Other (non HMO) | Admitting: Oncology

## 2021-03-03 ENCOUNTER — Ambulatory Visit: Payer: Managed Care, Other (non HMO)

## 2021-03-03 ENCOUNTER — Other Ambulatory Visit: Payer: Managed Care, Other (non HMO)

## 2021-03-08 ENCOUNTER — Other Ambulatory Visit: Payer: Self-pay | Admitting: Oncology

## 2021-03-08 ENCOUNTER — Encounter: Payer: Self-pay | Admitting: Oncology

## 2021-03-10 ENCOUNTER — Other Ambulatory Visit: Payer: Self-pay | Admitting: Oncology

## 2021-03-10 ENCOUNTER — Encounter: Payer: Self-pay | Admitting: Oncology

## 2021-03-10 DIAGNOSIS — C641 Malignant neoplasm of right kidney, except renal pelvis: Secondary | ICD-10-CM

## 2021-03-15 ENCOUNTER — Inpatient Hospital Stay: Payer: BC Managed Care – PPO | Attending: Oncology

## 2021-03-15 ENCOUNTER — Inpatient Hospital Stay (HOSPITAL_BASED_OUTPATIENT_CLINIC_OR_DEPARTMENT_OTHER): Payer: BC Managed Care – PPO | Admitting: Oncology

## 2021-03-15 ENCOUNTER — Other Ambulatory Visit: Payer: Self-pay

## 2021-03-15 ENCOUNTER — Inpatient Hospital Stay: Payer: BC Managed Care – PPO

## 2021-03-15 VITALS — BP 141/97 | HR 82 | Temp 98.1°F | Resp 17 | Ht 65.0 in | Wt 168.4 lb

## 2021-03-15 DIAGNOSIS — C7802 Secondary malignant neoplasm of left lung: Secondary | ICD-10-CM | POA: Diagnosis present

## 2021-03-15 DIAGNOSIS — C641 Malignant neoplasm of right kidney, except renal pelvis: Secondary | ICD-10-CM

## 2021-03-15 DIAGNOSIS — E079 Disorder of thyroid, unspecified: Secondary | ICD-10-CM | POA: Diagnosis not present

## 2021-03-15 DIAGNOSIS — C787 Secondary malignant neoplasm of liver and intrahepatic bile duct: Secondary | ICD-10-CM | POA: Diagnosis not present

## 2021-03-15 DIAGNOSIS — C649 Malignant neoplasm of unspecified kidney, except renal pelvis: Secondary | ICD-10-CM | POA: Insufficient documentation

## 2021-03-15 DIAGNOSIS — C7801 Secondary malignant neoplasm of right lung: Secondary | ICD-10-CM | POA: Insufficient documentation

## 2021-03-15 DIAGNOSIS — Z5112 Encounter for antineoplastic immunotherapy: Secondary | ICD-10-CM | POA: Insufficient documentation

## 2021-03-15 DIAGNOSIS — Z905 Acquired absence of kidney: Secondary | ICD-10-CM | POA: Diagnosis not present

## 2021-03-15 DIAGNOSIS — Z79899 Other long term (current) drug therapy: Secondary | ICD-10-CM | POA: Diagnosis not present

## 2021-03-15 LAB — CBC WITH DIFFERENTIAL (CANCER CENTER ONLY)
Abs Immature Granulocytes: 0.01 10*3/uL (ref 0.00–0.07)
Basophils Absolute: 0 10*3/uL (ref 0.0–0.1)
Basophils Relative: 0 %
Eosinophils Absolute: 0.4 10*3/uL (ref 0.0–0.5)
Eosinophils Relative: 6 %
HCT: 45 % (ref 39.0–52.0)
Hemoglobin: 16 g/dL (ref 13.0–17.0)
Immature Granulocytes: 0 %
Lymphocytes Relative: 55 %
Lymphs Abs: 3.7 10*3/uL (ref 0.7–4.0)
MCH: 32.6 pg (ref 26.0–34.0)
MCHC: 35.6 g/dL (ref 30.0–36.0)
MCV: 91.6 fL (ref 80.0–100.0)
Monocytes Absolute: 0.4 10*3/uL (ref 0.1–1.0)
Monocytes Relative: 6 %
Neutro Abs: 2.2 10*3/uL (ref 1.7–7.7)
Neutrophils Relative %: 33 %
Platelet Count: 292 10*3/uL (ref 150–400)
RBC: 4.91 MIL/uL (ref 4.22–5.81)
RDW: 13 % (ref 11.5–15.5)
WBC Count: 6.7 10*3/uL (ref 4.0–10.5)
nRBC: 0 % (ref 0.0–0.2)

## 2021-03-15 LAB — CMP (CANCER CENTER ONLY)
ALT: 94 U/L — ABNORMAL HIGH (ref 0–44)
AST: 53 U/L — ABNORMAL HIGH (ref 15–41)
Albumin: 4.2 g/dL (ref 3.5–5.0)
Alkaline Phosphatase: 75 U/L (ref 38–126)
Anion gap: 9 (ref 5–15)
BUN: 14 mg/dL (ref 6–20)
CO2: 27 mmol/L (ref 22–32)
Calcium: 9.3 mg/dL (ref 8.9–10.3)
Chloride: 105 mmol/L (ref 98–111)
Creatinine: 1.41 mg/dL — ABNORMAL HIGH (ref 0.61–1.24)
GFR, Estimated: >60 mL/min (ref >60)
Glucose, Bld: 118 mg/dL — ABNORMAL HIGH (ref 70–99)
Potassium: 3.8 mmol/L (ref 3.5–5.1)
Sodium: 141 mmol/L (ref 135–145)
Total Bilirubin: 0.5 mg/dL (ref 0.3–1.2)
Total Protein: 7.6 g/dL (ref 6.5–8.1)

## 2021-03-15 LAB — TSH: TSH: 5.519 u[IU]/mL — ABNORMAL HIGH (ref 0.320–4.118)

## 2021-03-15 MED ORDER — SODIUM CHLORIDE 0.9 % IV SOLN
480.0000 mg | Freq: Once | INTRAVENOUS | Status: AC
  Administered 2021-03-15: 480 mg via INTRAVENOUS
  Filled 2021-03-15: qty 48

## 2021-03-15 MED ORDER — SODIUM CHLORIDE 0.9 % IV SOLN: Freq: Once | INTRAVENOUS | Status: AC

## 2021-03-15 NOTE — Progress Notes (Signed)
Per Dr. Alen Blew, ok to proceed with elevated BP and elevated ALT

## 2021-03-15 NOTE — Patient Instructions (Signed)
[facility]   ?Discharge Instructions: ?Thank you for choosing [facility] to provide your oncology and hematology care.  ? ?If you have a lab appointment with the Cancer Center, please go directly to the Cancer Center and check in at the registration area. ?  ?Wear comfortable clothing and clothing appropriate for easy access to any Portacath or PICC line.  ? ?We strive to give you quality time with your provider. You may need to reschedule your appointment if you arrive late (15 or more minutes).  Arriving late affects you and other patients whose appointments are after yours.  Also, if you miss three or more appointments without notifying the office, you may be dismissed from the clinic at the provider?s discretion.    ?  ?For prescription refill requests, have your pharmacy contact our office and allow 72 hours for refills to be completed.   ? ?Today you received the following chemotherapy and/or immunotherapy agents: Nivolumab (Opdivo)    ?  ?To help prevent nausea and vomiting after your treatment, we encourage you to take your nausea medication as directed. ? ?BELOW ARE SYMPTOMS THAT SHOULD BE REPORTED IMMEDIATELY: ?*FEVER GREATER THAN 100.4 F (38 ?C) OR HIGHER ?*CHILLS OR SWEATING ?*NAUSEA AND VOMITING THAT IS NOT CONTROLLED WITH YOUR NAUSEA MEDICATION ?*UNUSUAL SHORTNESS OF BREATH ?*UNUSUAL BRUISING OR BLEEDING ?*URINARY PROBLEMS (pain or burning when urinating, or frequent urination) ?*BOWEL PROBLEMS (unusual diarrhea, constipation, pain near the anus) ?TENDERNESS IN MOUTH AND THROAT WITH OR WITHOUT PRESENCE OF ULCERS (sore throat, sores in mouth, or a toothache) ?UNUSUAL RASH, SWELLING OR PAIN  ?UNUSUAL VAGINAL DISCHARGE OR ITCHING  ? ?Items with * indicate a potential emergency and should be followed up as soon as possible or go to the Emergency Department if any problems should occur. ? ?Please show the CHEMOTHERAPY ALERT CARD or IMMUNOTHERAPY ALERT CARD at  check-in to the Emergency Department and triage nurse. ? ?Should you have questions after your visit or need to cancel or reschedule your appointment, please contact [facility]  Dept: [phone]  and follow the prompts.  Office hours are 8:00 a.m. to 4:30 p.m. Monday - Friday. Please note that voicemails left after 4:00 p.m. may not be returned until the following business day.  We are closed weekends and major holidays. You have access to a nurse at all times for urgent questions. Please call the main number to the clinic Dept: [phone] and follow the prompts. ? ? ?For any non-urgent questions, you may also contact your provider using MyChart. We now offer e-Visits for anyone 18 and older to request care online for non-urgent symptoms. For details visit [email or web address]. ?  ?Also download the MyChart app! Go to the app store, search "MyChart", open the app, select [facility], and log in with your MyChart username and password. ? ?Due to Covid, a mask is required upon entering the hospital/clinic. If you do not have a mask, one will be given to you upon arrival. For doctor visits, patients may have 1 support person aged 18 or older with them. For treatment visits, patients cannot have anyone with them due to current Covid guidelines and our immunocompromised population.  ? ?

## 2021-03-15 NOTE — Progress Notes (Signed)
Hematology and Oncology Follow Up Visit  Gordon Thomas [id number] 05-30-1980 41 y.o. 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  Prior Therapy: He is status post right radical nephrectomy completed on June 12, 2019.  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  Ipilimumab 1 mg/kg and nivolumab 3 mg/kg given every 3 weeks. Cycle 1 given on March 01, 2020. He completed 4 cycles of therapy on May 04, 2020.  Current therapy: Nivolumab 480 mg every 4 weeks to start on March 30th 2022.  Cabozantinib 40 mg started on June 10, 2020.   His dose was reduced to 20 mg in May 2022.  He presents for the subsequent cycle of therapy.  Interim History: Gordon Thomas is here for a follow-up visit.  Since the last visit, he reports no major changes in his health.  He continues to tolerate treatment without any concerns at this time.  He denies any nausea, vomiting or abdominal pain.  He does report some occasional loose bowel habits but no severe diarrhea or nausea.  He is eating well and continues to attend to activities of daily living without any decline.     Medications: Reviewed without changes. Current Outpatient Medications  Medication Sig Dispense Refill   albuterol (VENTOLIN HFA) 108 (90 Base) MCG/ACT inhaler Inhale 1-2 puffs into the lungs every 6 (six) hours as needed for wheezing or shortness of breath.     CABOMETYX 20 MG tablet TAKE 1 TABLET DAILY ON AN EMPTY STOMACH, 1 HOUR BEFORE OR 2 HOURS AFTER A MEAL 30 tablet 0   docusate sodium (COLACE) 100 MG capsule Take 1 capsule (100 mg total) by mouth 2 (two) times daily.     prochlorperazine (COMPAZINE) 10 MG tablet Take 1 tablet (10 mg total) by mouth every 6 (six) hours as needed for nausea or vomiting. 30 tablet 0   promethazine (PHENERGAN) 12.5 MG  tablet Take 1 tablet (12.5 mg total) by mouth every 4 (four) hours as needed for nausea or vomiting. 15 tablet 0   No current facility-administered medications for this visit.     Allergies: No Known Allergies    Physical Exam:       Blood pressure (!) 141/97, pulse 82, temperature 98.1 F (36.7 C), temperature source Temporal, resp. rate 17, height 5\' 5"  (1.651 m), weight 168 lb 6.4 oz (76.4 kg), SpO2 99 %.      ECOG:  0    General appearance: Comfortable appearing without any discomfort Head: Normocephalic without any trauma Oropharynx: Mucous membranes are moist and pink without any thrush or ulcers. Eyes: Pupils are equal and round reactive to light. Lymph nodes: No cervical, supraclavicular, inguinal or axillary lymphadenopathy.   Heart:regular rate and rhythm.  S1 and S2 without leg edema. Lung: Clear without any rhonchi or wheezes.  No dullness to percussion. Abdomin: Soft, nontender, nondistended with good bowel sounds.  No hepatosplenomegaly. Musculoskeletal: No joint deformity or effusion.  Full range of motion noted. Neurological: No deficits noted on motor, sensory and deep tendon reflex exam. Skin: No petechial rash or dryness.  Appeared moist.                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   RIGHT lower lobe nodule also with slight interval  enlargement.   No signs of new disease.   The suspected metastatic lesion in the medial segment of the LEFT hepatic lobe is not seen overall hepatic assessment is quite limited due to the presence of steatosis and lack of intravenous contrast.   Post RIGHT nephrectomy and adrenalectomy.     Impression and Plan:   41 year old with:  1.  Stage IV clear-cell renal cell carcinoma with pulmonary involvement diagnosed in September 2021.  The natural course of his disease was reviewed at this time and treatment choices discussed.  CT scan obtained on 02/28/2021 were reviewed at this time.  His overall disease status remained stable with the slight enlargement of his pulmonary nodules noted.  Risks and benefits of continuing this treatment versus different salvage therapy options were discussed.  These options include lenvatinib with Pembrolizumab as a potential option.  For the time being, given the limited change in his disease I recommended continuing the same dose and schedule.  Schedule presents due to progression versus a true progression.   He is agreeable to proceed and will continue with the same dose and schedule.  2.  Immune mediated complications: He is not experiencing any at this time.  I continue to educate him about pneumonitis, colitis and thyroid disease.  3.  IV access: No issues reported with peripheral veins at this time.  We will continue to be in use.  4.  Antiemetics: No nausea or vomiting reported at this time.  Compazine is available.  5.  Goals of care and prognosis discussion: Therapy remains palliative though aggressive measures are warranted at this time.   6.  Elevated liver function test: Mild elevation noted without any need for changes at this time.  7.  Follow-up: In 4 weeks for repeat follow-up.   30  minutes were spent on this visit.  The time was dedicated to reviewing laboratory data, disease status update and outlining future plan of  care.   Gordon Button, MD 1/11/20238:13 AM

## 2021-03-23 ENCOUNTER — Other Ambulatory Visit (HOSPITAL_COMMUNITY): Payer: Self-pay

## 2021-03-23 ENCOUNTER — Encounter: Payer: Self-pay | Admitting: Oncology

## 2021-03-23 ENCOUNTER — Telehealth: Payer: Self-pay

## 2021-03-23 NOTE — Telephone Encounter (Signed)
Oral Oncology Patient Advocate Encounter   Received notification from [facility] that prior authorization for Cabometyx is required.   PA submitted on CoverMyMeds [facility] Status is pending   Oral Oncology Clinic will continue to follow.   [place] Patient [place] Phone [phone] Fax [phone] 03/23/2021 3:22 PM

## 2021-03-24 NOTE — Telephone Encounter (Signed)
Oral Oncology Patient Advocate Encounter  Prior Authorization for Cabometyx has been approved.    PA# [id number] Effective dates: 03/23/21 through 03/22/22  Oral Oncology Clinic will continue to follow.   [place] Patient Gordon Thomas Phone [phone] Fax [phone] 03/24/2021 9:04 AM

## 2021-03-31 ENCOUNTER — Telehealth: Payer: Self-pay | Admitting: Oncology

## 2021-03-31 NOTE — Telephone Encounter (Signed)
Rescheduled March appointments, called patient and left a voicemail regarding appointment change.

## 2021-04-05 ENCOUNTER — Other Ambulatory Visit (HOSPITAL_COMMUNITY): Payer: Self-pay

## 2021-04-06 ENCOUNTER — Telehealth: Payer: Self-pay | Admitting: Oncology

## 2021-04-06 NOTE — Telephone Encounter (Signed)
Called patient regarding upcoming February and March appointments, patient is notified. 

## 2021-04-10 ENCOUNTER — Other Ambulatory Visit (HOSPITAL_COMMUNITY): Payer: Self-pay

## 2021-04-12 ENCOUNTER — Inpatient Hospital Stay (HOSPITAL_BASED_OUTPATIENT_CLINIC_OR_DEPARTMENT_OTHER): Payer: BC Managed Care – PPO | Admitting: Oncology

## 2021-04-12 ENCOUNTER — Inpatient Hospital Stay: Payer: BC Managed Care – PPO

## 2021-04-12 ENCOUNTER — Other Ambulatory Visit: Payer: Self-pay

## 2021-04-12 ENCOUNTER — Inpatient Hospital Stay: Payer: BC Managed Care – PPO | Attending: Oncology

## 2021-04-12 VITALS — BP 120/91 | HR 95 | Temp 98.1°F | Resp 16 | Ht 65.0 in | Wt 169.1 lb

## 2021-04-12 DIAGNOSIS — Z79899 Other long term (current) drug therapy: Secondary | ICD-10-CM | POA: Diagnosis not present

## 2021-04-12 DIAGNOSIS — C641 Malignant neoplasm of right kidney, except renal pelvis: Secondary | ICD-10-CM

## 2021-04-12 DIAGNOSIS — C649 Malignant neoplasm of unspecified kidney, except renal pelvis: Secondary | ICD-10-CM | POA: Insufficient documentation

## 2021-04-12 DIAGNOSIS — Z905 Acquired absence of kidney: Secondary | ICD-10-CM | POA: Diagnosis not present

## 2021-04-12 DIAGNOSIS — Z5112 Encounter for antineoplastic immunotherapy: Secondary | ICD-10-CM | POA: Diagnosis not present

## 2021-04-12 DIAGNOSIS — C7802 Secondary malignant neoplasm of left lung: Secondary | ICD-10-CM | POA: Insufficient documentation

## 2021-04-12 DIAGNOSIS — R7989 Other specified abnormal findings of blood chemistry: Secondary | ICD-10-CM | POA: Diagnosis not present

## 2021-04-12 LAB — CMP (CANCER CENTER ONLY)
ALT: 68 U/L — ABNORMAL HIGH (ref 0–44)
AST: 44 U/L — ABNORMAL HIGH (ref 15–41)
Albumin: 4.1 g/dL (ref 3.5–5.0)
Alkaline Phosphatase: 76 U/L (ref 38–126)
Anion gap: 8 (ref 5–15)
BUN: 13 mg/dL (ref 6–20)
CO2: 27 mmol/L (ref 22–32)
Calcium: 9.2 mg/dL (ref 8.9–10.3)
Chloride: 105 mmol/L (ref 98–111)
Creatinine: 1.36 mg/dL — ABNORMAL HIGH (ref 0.61–1.24)
GFR, Estimated: >60 mL/min (ref >60)
Glucose, Bld: 84 mg/dL (ref 70–99)
Potassium: 4 mmol/L (ref 3.5–5.1)
Sodium: 140 mmol/L (ref 135–145)
Total Bilirubin: 0.4 mg/dL (ref 0.3–1.2)
Total Protein: 7.2 g/dL (ref 6.5–8.1)

## 2021-04-12 LAB — CBC WITH DIFFERENTIAL (CANCER CENTER ONLY)
Abs Immature Granulocytes: 0.01 10*3/uL (ref 0.00–0.07)
Basophils Absolute: 0 10*3/uL (ref 0.0–0.1)
Basophils Relative: 1 %
Eosinophils Absolute: 0.2 10*3/uL (ref 0.0–0.5)
Eosinophils Relative: 3 %
HCT: 42 % (ref 39.0–52.0)
Hemoglobin: 15 g/dL (ref 13.0–17.0)
Immature Granulocytes: 0 %
Lymphocytes Relative: 41 %
Lymphs Abs: 3.3 10*3/uL (ref 0.7–4.0)
MCH: 32.8 pg (ref 26.0–34.0)
MCHC: 35.7 g/dL (ref 30.0–36.0)
MCV: 91.9 fL (ref 80.0–100.0)
Monocytes Absolute: 0.5 10*3/uL (ref 0.1–1.0)
Monocytes Relative: 6 %
Neutro Abs: 4 10*3/uL (ref 1.7–7.7)
Neutrophils Relative %: 49 %
Platelet Count: 285 10*3/uL (ref 150–400)
RBC: 4.57 MIL/uL (ref 4.22–5.81)
RDW: 13.6 % (ref 11.5–15.5)
WBC Count: 8.1 10*3/uL (ref 4.0–10.5)
nRBC: 0 % (ref 0.0–0.2)

## 2021-04-12 LAB — TSH: TSH: 5.647 u[IU]/mL — ABNORMAL HIGH (ref 0.320–4.118)

## 2021-04-12 MED ORDER — SODIUM CHLORIDE 0.9 % IV SOLN: Freq: Once | INTRAVENOUS | Status: AC

## 2021-04-12 MED ORDER — SODIUM CHLORIDE 0.9 % IV SOLN
480.0000 mg | Freq: Once | INTRAVENOUS | Status: AC
  Administered 2021-04-12: 480 mg via INTRAVENOUS
  Filled 2021-04-12: qty 48

## 2021-04-12 NOTE — Patient Instructions (Signed)
[facility]  Discharge Instructions: Thank you for choosing [facility] to provide your oncology and hematology care.   If you have a lab appointment with the Cancer Center, please go directly to the Cancer Center and check in at the registration area.   Wear comfortable clothing and clothing appropriate for easy access to any Portacath or PICC line.   We strive to give you quality time with your provider. You may need to reschedule your appointment if you arrive late (15 or more minutes).  Arriving late affects you and other patients whose appointments are after yours.  Also, if you miss three or more appointments without notifying the office, you may be dismissed from the clinic at the provider's discretion.      For prescription refill requests, have your pharmacy contact our office and allow 72 hours for refills to be completed.    Today you received the following chemotherapy and/or immunotherapy agents opdivo   To help prevent nausea and vomiting after your treatment, we encourage you to take your nausea medication as directed.  BELOW ARE SYMPTOMS THAT SHOULD BE REPORTED IMMEDIATELY: *FEVER GREATER THAN 100.4 F (38 C) OR HIGHER *CHILLS OR SWEATING *NAUSEA AND VOMITING THAT IS NOT CONTROLLED WITH YOUR NAUSEA MEDICATION *UNUSUAL SHORTNESS OF BREATH *UNUSUAL BRUISING OR BLEEDING *URINARY PROBLEMS (pain or burning when urinating, or frequent urination) *BOWEL PROBLEMS (unusual diarrhea, constipation, pain near the anus) TENDERNESS IN MOUTH AND THROAT WITH OR WITHOUT PRESENCE OF ULCERS (sore throat, sores in mouth, or a toothache) UNUSUAL RASH, SWELLING OR PAIN  UNUSUAL VAGINAL DISCHARGE OR ITCHING   Items with * indicate a potential emergency and should be followed up as soon as possible or go to the Emergency Department if any problems should occur.  Please show the CHEMOTHERAPY ALERT CARD or IMMUNOTHERAPY ALERT CARD at check-in to the  Emergency Department and triage nurse.  Should you have questions after your visit or need to cancel or reschedule your appointment, please contact [facility]  Dept: [phone]  and follow the prompts.  Office hours are 8:00 a.m. to 4:30 p.m. Monday - Friday. Please note that voicemails left after 4:00 p.m. may not be returned until the following business day.  We are closed weekends and major holidays. You have access to a nurse at all times for urgent questions. Please call the main number to the clinic Dept: [phone] and follow the prompts.   For any non-urgent questions, you may also contact your provider using MyChart. We now offer e-Visits for anyone 18 and older to request care online for non-urgent symptoms. For details visit [email or web address].   Also download the MyChart app! Go to the app store, search "MyChart", open the app, select , and log in with your MyChart username and password.  Due to Covid, a mask is required upon entering the hospital/clinic. If you do not have a mask, one will be given to you upon arrival. For doctor visits, patients may have 1 support person aged 18 or older with them. For treatment visits, patients cannot have anyone with them due to current Covid guidelines and our immunocompromised population.   

## 2021-04-12 NOTE — Progress Notes (Signed)
Hematology and Oncology Follow Up Visit  Gordon Thomas [id number] Dec 16, 1980 41 y.o. 04/12/2021 11:43 AM Gordon Thomas, MDShelton, [name], MD   Principle Diagnosis: 41 year old man with kidney cancer diagnosed in 2021.  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  Prior Therapy: He is status post right radical nephrectomy completed on June 12, 2019.  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  Ipilimumab 1 mg/kg and nivolumab 3 mg/kg given every 3 weeks. Cycle 1 given on March 01, 2020. He completed 4 cycles of therapy on May 04, 2020.  Current therapy: Nivolumab 480 mg every 4 weeks to start on March 30th 2022.  Cabozantinib 40 mg started on June 10, 2020.   His dose was reduced to 20 mg in May 2022.  He presents for the subsequent cycle of therapy.  Interim History: [name] returns today for repeat evaluation.  Since the last visit, he reports no major changes in his health.  He continues to tolerate the current therapy without any complaints.  He denies any nausea, vomiting or abdominal pain.  He has reported very periodic but rare diarrhea that is manageable.  He denies any shortness of breath or difficulty breathing.  He denies any constitutional symptoms.     Medications: Reviewed without changes. Current Outpatient Medications  Medication Sig Dispense Refill   albuterol (VENTOLIN HFA) 108 (90 Base) MCG/ACT inhaler Inhale 1-2 puffs into the lungs every 6 (six) hours as needed for wheezing or shortness of breath.     CABOMETYX 20 MG tablet TAKE 1 TABLET DAILY ON AN EMPTY STOMACH, 1 HOUR BEFORE OR 2 HOURS AFTER A MEAL 30 tablet 0   docusate sodium (COLACE) 100 MG capsule Take 1 capsule (100 mg total) by mouth 2 (two) times daily.     prochlorperazine (COMPAZINE) 10 MG tablet Take 1 tablet (10 mg total) by mouth every 6 (six) hours as needed for nausea or vomiting. 30 tablet 0   promethazine (PHENERGAN) 12.5 MG  tablet Take 1 tablet (12.5 mg total) by mouth every 4 (four) hours as needed for nausea or vomiting. 15 tablet 0   No current facility-administered medications for this visit.     Allergies: No Known Allergies    Physical Exam:      Blood pressure (!) 120/91, pulse 95, temperature 98.1 F (36.7 C), temperature source Temporal, resp. rate 16, height 5\' 5"  (1.651 m), weight 169 lb 1.6 oz (76.7 kg), SpO2 100 %.        ECOG:  0      General appearance: Alert, awake without any distress. Head: Atraumatic without abnormalities Oropharynx: Without any thrush or ulcers. Eyes: No scleral icterus. Lymph nodes: No lymphadenopathy noted in the cervical, supraclavicular, or axillary nodes Heart:regular rate and rhythm, without any murmurs or gallops.   Lung: Clear to auscultation without any rhonchi, wheezes or dullness to percussion. Abdomin: Soft, nontender without any shifting dullness or ascites. Musculoskeletal: No clubbing or cyanosis. Neurological: No motor or sensory deficits. Skin: No rashes or lesions.                 Lab Results: Lab Results  Component Value Date   WBC 6.7 03/15/2021   HGB 16.0 03/15/2021   HCT 45.0 03/15/2021   MCV 91.6 03/15/2021   PLT 292 03/15/2021     Chemistry      Component Value Date/Time   NA 141 03/15/2021 0805   K 3.8 03/15/2021 0805  CL 105 03/15/2021 0805   CO2 27 03/15/2021 0805   BUN 14 03/15/2021 0805   CREATININE 1.41 (H) 03/15/2021 0805      Component Value Date/Time   CALCIUM 9.3 03/15/2021 0805   ALKPHOS 75 03/15/2021 0805   AST 53 (H) 03/15/2021 0805   ALT 94 (H) 03/15/2021 0805   BILITOT 0.5 03/15/2021 0805          Impression and Plan:   38 year old with:  1.  Kidney cancer diagnosed in 2021.  He developed stage IV clear-cell with pulmonary involvement at that time.  Treatment choices moving forward were discussed at this time.  Risks and benefits of continuing current treatment were  reviewed.  Complications associated with cabozantinib including nausea, diarrhea as well as immune mediated reaction related to nivolumab were discussed.  Alternative treatment options including lenvatinib with Pembrolizumab were also discussed at this time.  The plan is to update his staging scan in 2 months and we will determine best course of action accordingly.  He will continue to receive that at the same dose and schedule for the time being.  He is agreeable to proceed.    2.  Immune mediated complications: Complications such as pneumonitis, colitis, hepatitis and thyroid disease were reviewed.  He is not experiencing any and will continue to educate him about these possibilities.  3.  IV access: No issues reported with IV access peripherally.  Already cath options been deferred.  4.  Antiemetics: Compazine is available to her without any nausea or vomiting.  5.  Goals of care and prognosis discussion: Therapy remains palliative although aggressive measures are warranted at this time.   6.  Elevated liver function test: Related to current treatment at this time.  His AST and ALT have fluctuated and we will continue to monitor.  7.  Follow-up: He will return in 4 weeks for repeat follow-up.   30  minutes were spent on this encounter.  The time was dedicated to reviewing treatment choices, addressing complications related to cancer and cancer therapy and future plan of care review.   Zola Button, MD 2/8/202311:43 AM

## 2021-04-19 ENCOUNTER — Other Ambulatory Visit (HOSPITAL_COMMUNITY): Payer: Self-pay

## 2021-04-21 ENCOUNTER — Other Ambulatory Visit: Payer: Self-pay | Admitting: Oncology

## 2021-04-21 DIAGNOSIS — C641 Malignant neoplasm of right kidney, except renal pelvis: Secondary | ICD-10-CM

## 2021-05-06 ENCOUNTER — Other Ambulatory Visit: Payer: Self-pay | Admitting: Oncology

## 2021-05-06 DIAGNOSIS — C641 Malignant neoplasm of right kidney, except renal pelvis: Secondary | ICD-10-CM

## 2021-05-08 ENCOUNTER — Encounter: Payer: Self-pay | Admitting: Oncology

## 2021-05-09 ENCOUNTER — Inpatient Hospital Stay (HOSPITAL_BASED_OUTPATIENT_CLINIC_OR_DEPARTMENT_OTHER): Payer: BC Managed Care – PPO | Admitting: Oncology

## 2021-05-09 ENCOUNTER — Inpatient Hospital Stay: Payer: BC Managed Care – PPO | Attending: Oncology

## 2021-05-09 ENCOUNTER — Other Ambulatory Visit: Payer: Self-pay

## 2021-05-09 ENCOUNTER — Inpatient Hospital Stay: Payer: BC Managed Care – PPO

## 2021-05-09 ENCOUNTER — Ambulatory Visit: Payer: BC Managed Care – PPO | Admitting: Oncology

## 2021-05-09 ENCOUNTER — Ambulatory Visit: Payer: BC Managed Care – PPO

## 2021-05-09 ENCOUNTER — Other Ambulatory Visit: Payer: BC Managed Care – PPO

## 2021-05-09 VITALS — BP 117/86 | HR 83 | Temp 98.1°F | Resp 16 | Ht 65.0 in | Wt 165.1 lb

## 2021-05-09 DIAGNOSIS — C7802 Secondary malignant neoplasm of left lung: Secondary | ICD-10-CM | POA: Diagnosis present

## 2021-05-09 DIAGNOSIS — R7989 Other specified abnormal findings of blood chemistry: Secondary | ICD-10-CM | POA: Diagnosis not present

## 2021-05-09 DIAGNOSIS — C641 Malignant neoplasm of right kidney, except renal pelvis: Secondary | ICD-10-CM

## 2021-05-09 DIAGNOSIS — Z905 Acquired absence of kidney: Secondary | ICD-10-CM | POA: Insufficient documentation

## 2021-05-09 DIAGNOSIS — C649 Malignant neoplasm of unspecified kidney, except renal pelvis: Secondary | ICD-10-CM | POA: Insufficient documentation

## 2021-05-09 DIAGNOSIS — Z79620 Long term (current) use of immunosuppressive biologic: Secondary | ICD-10-CM | POA: Diagnosis not present

## 2021-05-09 DIAGNOSIS — Z5112 Encounter for antineoplastic immunotherapy: Secondary | ICD-10-CM | POA: Insufficient documentation

## 2021-05-09 LAB — CMP (CANCER CENTER ONLY)
ALT: 53 U/L — ABNORMAL HIGH (ref 0–44)
AST: 38 U/L (ref 15–41)
Albumin: 4.2 g/dL (ref 3.5–5.0)
Alkaline Phosphatase: 69 U/L (ref 38–126)
Anion gap: 7 (ref 5–15)
BUN: 12 mg/dL (ref 6–20)
CO2: 25 mmol/L (ref 22–32)
Calcium: 8.9 mg/dL (ref 8.9–10.3)
Chloride: 105 mmol/L (ref 98–111)
Creatinine: 1.26 mg/dL — ABNORMAL HIGH (ref 0.61–1.24)
GFR, Estimated: >60 mL/min (ref >60)
Glucose, Bld: 91 mg/dL (ref 70–99)
Potassium: 4.2 mmol/L (ref 3.5–5.1)
Sodium: 137 mmol/L (ref 135–145)
Total Bilirubin: 0.5 mg/dL (ref 0.3–1.2)
Total Protein: 7.2 g/dL (ref 6.5–8.1)

## 2021-05-09 LAB — CBC WITH DIFFERENTIAL (CANCER CENTER ONLY)
Abs Immature Granulocytes: 0.01 10*3/uL (ref 0.00–0.07)
Basophils Absolute: 0 10*3/uL (ref 0.0–0.1)
Basophils Relative: 0 %
Eosinophils Absolute: 0.4 10*3/uL (ref 0.0–0.5)
Eosinophils Relative: 6 %
HCT: 43.2 % (ref 39.0–52.0)
Hemoglobin: 15.9 g/dL (ref 13.0–17.0)
Immature Granulocytes: 0 %
Lymphocytes Relative: 50 %
Lymphs Abs: 3.2 10*3/uL (ref 0.7–4.0)
MCH: 33.5 pg (ref 26.0–34.0)
MCHC: 36.8 g/dL — ABNORMAL HIGH (ref 30.0–36.0)
MCV: 91.1 fL (ref 80.0–100.0)
Monocytes Absolute: 0.4 10*3/uL (ref 0.1–1.0)
Monocytes Relative: 7 %
Neutro Abs: 2.4 10*3/uL (ref 1.7–7.7)
Neutrophils Relative %: 37 %
Platelet Count: 296 10*3/uL (ref 150–400)
RBC: 4.74 MIL/uL (ref 4.22–5.81)
RDW: 13.2 % (ref 11.5–15.5)
WBC Count: 6.4 10*3/uL (ref 4.0–10.5)
nRBC: 0 % (ref 0.0–0.2)

## 2021-05-09 LAB — TSH: TSH: 4.052 u[IU]/mL (ref 0.320–4.118)

## 2021-05-09 MED ORDER — SODIUM CHLORIDE 0.9 % IV SOLN: Freq: Once | INTRAVENOUS | Status: AC

## 2021-05-09 MED ORDER — SODIUM CHLORIDE 0.9 % IV SOLN
480.0000 mg | Freq: Once | INTRAVENOUS | Status: AC
  Administered 2021-05-09: 480 mg via INTRAVENOUS
  Filled 2021-05-09: qty 48

## 2021-05-09 NOTE — Patient Instructions (Signed)
[facility]  Discharge Instructions: ?Thank you for choosing [facility] to provide your oncology and hematology care.  ? ?If you have a lab appointment with the Cancer Center, please go directly to the Cancer Center and check in at the registration area. ?  ?Wear comfortable clothing and clothing appropriate for easy access to any Portacath or PICC line.  ? ?We strive to give you quality time with your provider. You may need to reschedule your appointment if you arrive late (15 or more minutes).  Arriving late affects you and other patients whose appointments are after yours.  Also, if you miss three or more appointments without notifying the office, you may be dismissed from the clinic at the provider?s discretion.    ?  ?For prescription refill requests, have your pharmacy contact our office and allow 72 hours for refills to be completed.   ? ?Today you received the following chemotherapy and/or immunotherapy agents Opdivo    ?  ?To help prevent nausea and vomiting after your treatment, we encourage you to take your nausea medication as directed. ? ?BELOW ARE SYMPTOMS THAT SHOULD BE REPORTED IMMEDIATELY: ?*FEVER GREATER THAN 100.4 F (38 ?C) OR HIGHER ?*CHILLS OR SWEATING ?*NAUSEA AND VOMITING THAT IS NOT CONTROLLED WITH YOUR NAUSEA MEDICATION ?*UNUSUAL SHORTNESS OF BREATH ?*UNUSUAL BRUISING OR BLEEDING ?*URINARY PROBLEMS (pain or burning when urinating, or frequent urination) ?*BOWEL PROBLEMS (unusual diarrhea, constipation, pain near the anus) ?TENDERNESS IN MOUTH AND THROAT WITH OR WITHOUT PRESENCE OF ULCERS (sore throat, sores in mouth, or a toothache) ?UNUSUAL RASH, SWELLING OR PAIN  ?UNUSUAL VAGINAL DISCHARGE OR ITCHING  ? ?Items with * indicate a potential emergency and should be followed up as soon as possible or go to the Emergency Department if any problems should occur. ? ?Please show the CHEMOTHERAPY ALERT CARD or IMMUNOTHERAPY ALERT CARD at check-in to the  Emergency Department and triage nurse. ? ?Should you have questions after your visit or need to cancel or reschedule your appointment, please contact [facility]  Dept: [phone]  and follow the prompts.  Office hours are 8:00 a.m. to 4:30 p.m. Monday - Friday. Please note that voicemails left after 4:00 p.m. may not be returned until the following business day.  We are closed weekends and major holidays. You have access to a nurse at all times for urgent questions. Please call the main number to the clinic Dept: [phone] and follow the prompts. ? ? ?For any non-urgent questions, you may also contact your provider using MyChart. We now offer e-Visits for anyone 18 and older to request care online for non-urgent symptoms. For details visit [email or web address]. ?  ?Also download the MyChart app! Go to the app store, search "MyChart", open the app, select [facility], and log in with your MyChart username and password. ? ?Due to Covid, a mask is required upon entering the hospital/clinic. If you do not have a mask, one will be given to you upon arrival. For doctor visits, patients may have 1 support person aged 18 or older with them. For treatment visits, patients cannot have anyone with them due to current Covid guidelines and our immunocompromised population.  ? ?

## 2021-05-09 NOTE — Progress Notes (Signed)
Hematology and Oncology Follow Up Visit ? ?Gordon Thomas ?[id number] ?1980/12/14 41 y.o. ?05/09/2021 8:31 AM ?Gordon Thomas, MDShelton, Gordon Millin, MD  ? 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  He initially presented with localized kidney mass in April 2021. ? ?Prior Therapy: ?He is status post right radical nephrectomy completed on June 12, 2019.  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 ? ?Ipilimumab 1 mg/kg and nivolumab 3 mg/kg given every 3 weeks. Cycle 1 given on March 01, 2020. He completed 4 cycles of therapy on May 04, 2020. ? ?Current therapy: Nivolumab 480 mg every 4 weeks to start on March 30th 2022.  Cabozantinib 40 mg started on June 10, 2020.   His dose was reduced to 20 mg in May 2022.  He is here for a follow-up visit. ? ?Interim History: [name] presents today for repeat evaluation.  Since last visit, he reports no major changes in his health.  He continues to tolerate current therapy without any major issues.  He denies any nausea, vomiting or abdominal pain.  He denies any recent hospitalizations or illnesses.  He does report occasional loose bowel habits but overall no persistent diarrhea.  He was able to exercise regularly at this time and has intentionally lost some weight. ? ? ? ? ?Medications: Updated on review. ?Current Outpatient Medications  ?Medication Sig Dispense Refill  ? albuterol (VENTOLIN HFA) 108 (90 Base) MCG/ACT inhaler Inhale 1-2 puffs into the lungs every 6 (six) hours as needed for wheezing or shortness of breath.    ? CABOMETYX 20 MG tablet TAKE 1 TABLET DAILY ON AN EMPTY STOMACH, 1 HOUR BEFORE OR 2 HOURS AFTER A MEAL 15 tablet 0  ? docusate sodium (COLACE) 100 MG capsule Take 1 capsule (100 mg total) by mouth 2 (two) times daily.    ? prochlorperazine (COMPAZINE) 10 MG tablet Take 1 tablet (10 mg total) by mouth every 6 (six) hours  as needed for nausea or vomiting. 30 tablet 0  ? promethazine (PHENERGAN) 12.5 MG tablet Take 1 tablet (12.5 mg total) by mouth every 4 (four) hours as needed for nausea or vomiting. 15 tablet 0  ? ?No current facility-administered medications for this visit.  ? ? ? ?Allergies: No Known Allergies ? ? ? ?Physical Exam: ? ? ? ? ? ? ? ?Blood pressure 117/86, pulse 83, temperature 98.1 ?F (36.7 ?C), temperature source Temporal, resp. rate 16, height '5\' 5"'$  (1.651 m), weight 165 lb 1.6 oz (74.9 kg), SpO2 99 %. ? ? ? ? ? ? ?ECOG:  0 ?  ? ? ?General appearance: Comfortable appearing without any discomfort ?Head: Normocephalic without any trauma ?Oropharynx: Mucous membranes are moist and pink without any thrush or ulcers. ?Eyes: Pupils are equal and round reactive to light. ?Lymph nodes: No cervical, supraclavicular, inguinal or axillary lymphadenopathy.   ?Heart:regular rate and rhythm.  S1 and S2 without leg edema. ?Lung: Clear without any rhonchi or wheezes.  No dullness to percussion. ?Abdomin: Soft, nontender, nondistended with good bowel sounds.  No hepatosplenomegaly. ?Musculoskeletal: No joint deformity or effusion.  Full range of motion noted. ?Neurological: No deficits noted on motor, sensory and deep tendon reflex exam. ?Skin: No petechial rash or dryness.  Appeared moist.  ? ? ? ? ? ? ? ? ? ? ? ? ? ? ? ? ? ?Lab Results: ?Lab Results  ?Component Value Date  ? WBC 8.1 04/12/2021  ?  HGB 15.0 04/12/2021  ? HCT 42.0 04/12/2021  ? MCV 91.9 04/12/2021  ? PLT 285 04/12/2021  ? ?  Chemistry   ?   ?Component Value Date/Time  ? NA 140 04/12/2021 1148  ? K 4.0 04/12/2021 1148  ? CL 105 04/12/2021 1148  ? CO2 27 04/12/2021 1148  ? BUN 13 04/12/2021 1148  ? CREATININE 1.36 (H) 04/12/2021 1148  ?    ?Component Value Date/Time  ? CALCIUM 9.2 04/12/2021 1148  ? ALKPHOS 76 04/12/2021 1148  ? AST 44 (H) 04/12/2021 1148  ? ALT 68 (H) 04/12/2021 1148  ? BILITOT 0.4 04/12/2021 1148  ?  ? ? ? ? ? ? ?Impression and  Plan: ? ? ?41 year old with: ? ?1.  Sage IV clear-cell renal cell carcinoma with pulmonary involvement diagnosed in 2021. ? ?His disease status was updated at this time and treatment choices were reviewed.  He has tolerated the current therapy without any major complaints.  The plan is to update his staging scan before the next visit.  Alternative treatment choices utilizing Inlyta or Lenvima alone other choices were discussed.  He is agreeable with this plan. ? ? ? ?2.  Immune mediated complications: I continue to reiterate these complications including pneumonitis, colitis and thyroid disease. ? ?3.  IV access: Port-A-Cath option has been deferred at this time.  Peripheral veins continues to be in use. ? ?4.  Antiemetics: No GI complications including nausea or vomiting at this time.  Compazine is available to him. ? ?5.  Goals of care and prognosis discussion: His disease is incurable but aggressive measures are warranted. ? ? ?6.  Elevated liver function test: He continues to have mild elevation in his AST and ALT will continue to monitor on subsequent visits.  This is related to current therapy. ? ? ?7.  Follow-up: In 1 month for a follow-up visit. ? ? ?30  minutes were dedicated to this visit.  The time was spent on reviewing laboratory data, disease status update and outlining future plan of care discussion. ? ? ?Gordon Button, MD ?3/7/20238:31 AM ? ? ? ? ?

## 2021-06-01 ENCOUNTER — Ambulatory Visit (HOSPITAL_COMMUNITY)
Admission: RE | Admit: 2021-06-01 | Discharge: 2021-06-01 | Disposition: A | Discharge status: Home / Self Care | Payer: BC Managed Care – PPO | Source: Ambulatory Visit | Attending: Oncology | Admitting: Oncology

## 2021-06-01 DIAGNOSIS — C641 Malignant neoplasm of right kidney, except renal pelvis: Secondary | ICD-10-CM | POA: Insufficient documentation

## 2021-06-07 ENCOUNTER — Other Ambulatory Visit: Payer: Self-pay

## 2021-06-07 ENCOUNTER — Inpatient Hospital Stay: Payer: BC Managed Care – PPO | Attending: Oncology

## 2021-06-07 ENCOUNTER — Inpatient Hospital Stay: Payer: BC Managed Care – PPO

## 2021-06-07 ENCOUNTER — Inpatient Hospital Stay (HOSPITAL_BASED_OUTPATIENT_CLINIC_OR_DEPARTMENT_OTHER): Payer: BC Managed Care – PPO | Admitting: Oncology

## 2021-06-07 VITALS — BP 129/94 | HR 87 | Temp 97.8°F | Resp 18 | Ht 65.0 in | Wt 168.2 lb

## 2021-06-07 DIAGNOSIS — C7802 Secondary malignant neoplasm of left lung: Secondary | ICD-10-CM | POA: Diagnosis present

## 2021-06-07 DIAGNOSIS — R7989 Other specified abnormal findings of blood chemistry: Secondary | ICD-10-CM | POA: Insufficient documentation

## 2021-06-07 DIAGNOSIS — Z5112 Encounter for antineoplastic immunotherapy: Secondary | ICD-10-CM | POA: Insufficient documentation

## 2021-06-07 DIAGNOSIS — C641 Malignant neoplasm of right kidney, except renal pelvis: Secondary | ICD-10-CM

## 2021-06-07 DIAGNOSIS — C649 Malignant neoplasm of unspecified kidney, except renal pelvis: Secondary | ICD-10-CM | POA: Diagnosis present

## 2021-06-07 DIAGNOSIS — Z79899 Other long term (current) drug therapy: Secondary | ICD-10-CM | POA: Insufficient documentation

## 2021-06-07 DIAGNOSIS — Z905 Acquired absence of kidney: Secondary | ICD-10-CM | POA: Diagnosis not present

## 2021-06-07 LAB — CMP (CANCER CENTER ONLY)
ALT: 60 U/L — ABNORMAL HIGH (ref 0–44)
AST: 43 U/L — ABNORMAL HIGH (ref 15–41)
Albumin: 4 g/dL (ref 3.5–5.0)
Alkaline Phosphatase: 73 U/L (ref 38–126)
Anion gap: 6 (ref 5–15)
BUN: 13 mg/dL (ref 6–20)
CO2: 28 mmol/L (ref 22–32)
Calcium: 9.2 mg/dL (ref 8.9–10.3)
Chloride: 103 mmol/L (ref 98–111)
Creatinine: 1.28 mg/dL — ABNORMAL HIGH (ref 0.61–1.24)
GFR, Estimated: >60 mL/min (ref >60)
Glucose, Bld: 93 mg/dL (ref 70–99)
Potassium: 4 mmol/L (ref 3.5–5.1)
Sodium: 137 mmol/L (ref 135–145)
Total Bilirubin: 0.3 mg/dL (ref 0.3–1.2)
Total Protein: 7.1 g/dL (ref 6.5–8.1)

## 2021-06-07 LAB — CBC WITH DIFFERENTIAL (CANCER CENTER ONLY)
Abs Immature Granulocytes: 0.01 10*3/uL (ref 0.00–0.07)
Basophils Absolute: 0 10*3/uL (ref 0.0–0.1)
Basophils Relative: 1 %
Eosinophils Absolute: 0.4 10*3/uL (ref 0.0–0.5)
Eosinophils Relative: 5 %
HCT: 44.6 % (ref 39.0–52.0)
Hemoglobin: 15.7 g/dL (ref 13.0–17.0)
Immature Granulocytes: 0 %
Lymphocytes Relative: 46 %
Lymphs Abs: 4 10*3/uL (ref 0.7–4.0)
MCH: 32.3 pg (ref 26.0–34.0)
MCHC: 35.2 g/dL (ref 30.0–36.0)
MCV: 91.8 fL (ref 80.0–100.0)
Monocytes Absolute: 0.7 10*3/uL (ref 0.1–1.0)
Monocytes Relative: 8 %
Neutro Abs: 3.4 10*3/uL (ref 1.7–7.7)
Neutrophils Relative %: 40 %
Platelet Count: 299 10*3/uL (ref 150–400)
RBC: 4.86 MIL/uL (ref 4.22–5.81)
RDW: 12.8 % (ref 11.5–15.5)
WBC Count: 8.5 10*3/uL (ref 4.0–10.5)
nRBC: 0 % (ref 0.0–0.2)

## 2021-06-07 LAB — TSH: TSH: 5.692 u[IU]/mL — ABNORMAL HIGH (ref 0.320–4.118)

## 2021-06-07 MED ORDER — SODIUM CHLORIDE 0.9 % IV SOLN
480.0000 mg | Freq: Once | INTRAVENOUS | Status: AC
  Administered 2021-06-07: 480 mg via INTRAVENOUS
  Filled 2021-06-07: qty 48

## 2021-06-07 MED ORDER — SODIUM CHLORIDE 0.9 % IV SOLN: Freq: Once | INTRAVENOUS | Status: AC

## 2021-06-07 NOTE — Patient Instructions (Signed)
[facility]  Discharge Instructions: Thank you for choosing [facility] to provide your oncology and hematology care.   If you have a lab appointment with the Cancer Center, please go directly to the Cancer Center and check in at the registration area.   Wear comfortable clothing and clothing appropriate for easy access to any Portacath or PICC line.   We strive to give you quality time with your provider. You may need to reschedule your appointment if you arrive late (15 or more minutes).  Arriving late affects you and other patients whose appointments are after yours.  Also, if you miss three or more appointments without notifying the office, you may be dismissed from the clinic at the provider's discretion.      For prescription refill requests, have your pharmacy contact our office and allow 72 hours for refills to be completed.    Today you received the following chemotherapy and/or immunotherapy agent: Opdivo      To help prevent nausea and vomiting after your treatment, we encourage you to take your nausea medication as directed.  BELOW ARE SYMPTOMS THAT SHOULD BE REPORTED IMMEDIATELY: *FEVER GREATER THAN 100.4 F (38 C) OR HIGHER *CHILLS OR SWEATING *NAUSEA AND VOMITING THAT IS NOT CONTROLLED WITH YOUR NAUSEA MEDICATION *UNUSUAL SHORTNESS OF BREATH *UNUSUAL BRUISING OR BLEEDING *URINARY PROBLEMS (pain or burning when urinating, or frequent urination) *BOWEL PROBLEMS (unusual diarrhea, constipation, pain near the anus) TENDERNESS IN MOUTH AND THROAT WITH OR WITHOUT PRESENCE OF ULCERS (sore throat, sores in mouth, or a toothache) UNUSUAL RASH, SWELLING OR PAIN  UNUSUAL VAGINAL DISCHARGE OR ITCHING   Items with * indicate a potential emergency and should be followed up as soon as possible or go to the Emergency Department if any problems should occur.  Please show the CHEMOTHERAPY ALERT CARD or IMMUNOTHERAPY ALERT CARD at check-in to the  Emergency Department and triage nurse.  Should you have questions after your visit or need to cancel or reschedule your appointment, please contact [facility]  Dept: [phone]  and follow the prompts.  Office hours are 8:00 a.m. to 4:30 p.m. Monday - Friday. Please note that voicemails left after 4:00 p.m. may not be returned until the following business day.  We are closed weekends and major holidays. You have access to a nurse at all times for urgent questions. Please call the main number to the clinic Dept: [phone] and follow the prompts.   For any non-urgent questions, you may also contact your provider using MyChart. We now offer e-Visits for anyone 18 and older to request care online for non-urgent symptoms. For details visit [email or web address].   Also download the MyChart app! Go to the app store, search "MyChart", open the app, select , and log in with your MyChart username and password.  Due to Covid, a mask is required upon entering the hospital/clinic. If you do not have a mask, one will be given to you upon arrival. For doctor visits, patients may have 1 support person aged 18 or older with them. For treatment visits, patients cannot have anyone with them due to current Covid guidelines and our immunocompromised population.   

## 2021-06-07 NOTE — Progress Notes (Signed)
Hematology and Oncology Follow Up Visit ? ?Gordon Thomas ?[id number] ?12-06-1980 41 y.o. ?06/07/2021 11:36 AM ?Gordon Thomas, MDShelton, [name], MD  ? ?Principle Diagnosis: 41 year old man with kidney cancer diagnosed in April 2021.  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 ?Prior Therapy: ?He is status post right radical nephrectomy completed on June 12, 2019.  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 ? ?Ipilimumab 1 mg/kg and nivolumab 3 mg/kg given every 3 weeks. Cycle 1 given on March 01, 2020. He completed 4 cycles of therapy on May 04, 2020. ? ?Current therapy: Nivolumab 480 mg every 4 weeks to start on March 30th 2022.  Cabozantinib 40 mg started on June 10, 2020.   His dose was reduced to 20 mg in May 2022.  He returns today for repeat evaluation. ? ?Interim History: [name] is here for a follow-up visit.  Since last visit, he reports feeling well without any major complaints.  He denies any nausea, vomiting or abdominal pain.  He did have occasional loose bowel habits which has resolved at this time.  He denies any arthralgias or myalgias.  His performance status quality of life remains unchanged. ? ? ? ? ?Medications: Reviewed without changes. ?Current Outpatient Medications  ?Medication Sig Dispense Refill  ? albuterol (VENTOLIN HFA) 108 (90 Base) MCG/ACT inhaler Inhale 1-2 puffs into the lungs every 6 (six) hours as needed for wheezing or shortness of breath.    ? CABOMETYX 20 MG tablet TAKE 1 TABLET DAILY ON AN EMPTY STOMACH, 1 HOUR BEFORE OR 2 HOURS AFTER A MEAL 15 tablet 0  ? docusate sodium (COLACE) 100 MG capsule Take 1 capsule (100 mg total) by mouth 2 (two) times daily.    ? prochlorperazine (COMPAZINE) 10 MG tablet Take 1 tablet (10 mg total) by mouth every 6 (six) hours as needed for nausea or vomiting. 30 tablet 0  ? promethazine (PHENERGAN) 12.5 MG tablet Take 1 tablet (12.5 mg total)  by mouth every 4 (four) hours as needed for nausea or vomiting. 15 tablet 0  ? ?No current facility-administered medications for this visit.  ? ? ? ?Allergies: No Known Allergies ? ? ? ?Physical Exam: ? ? ? ? ? ? ? ? ?Blood pressure (!) 129/94, pulse 87, temperature 97.8 ?F (36.6 ?C), temperature source Temporal, resp. rate 18, height '5\' 5"'$  (1.651 m), weight 168 lb 3.2 oz (76.3 kg), SpO2 98 %. ? ? ? ? ? ? ?ECOG:  0 ?  ? ? ?General appearance: Alert, awake without any distress. ?Head: Atraumatic without abnormalities ?Oropharynx: Without any thrush or ulcers. ?Eyes: No scleral icterus. ?Lymph nodes: No lymphadenopathy noted in the cervical, supraclavicular, or axillary nodes ?Heart:regular rate and rhythm, without any murmurs or gallops.   ?Lung: Clear to auscultation without any rhonchi, wheezes or dullness to percussion. ?Abdomin: Soft, nontender without any shifting dullness or ascites. ?Musculoskeletal: No clubbing or cyanosis. ?Neurological: No motor or sensory deficits. ?Skin: No rashes or lesions. ? ? ? ? ? ? ? ? ? ? ? ? ? ? ? ? ? ?Lab Results: ?Lab Results  ?Component Value Date  ? WBC 6.4 05/09/2021  ? HGB 15.9 05/09/2021  ? HCT 43.2 05/09/2021  ? MCV 91.1 05/09/2021  ? PLT 296 05/09/2021  ? ?  Chemistry   ?   ?Component Value Date/Time  ? NA 137 05/09/2021 0909  ? K 4.2 05/09/2021 0909  ? CL 105 05/09/2021 0909  ?  CO2 25 05/09/2021 0909  ? BUN 12 05/09/2021 0909  ? CREATININE 1.26 (H) 05/09/2021 0909  ?    ?Component Value Date/Time  ? CALCIUM 8.9 05/09/2021 0909  ? ALKPHOS 69 05/09/2021 0909  ? AST 38 05/09/2021 0909  ? ALT 53 (H) 05/09/2021 0909  ? BILITOT 0.5 05/09/2021 0909  ?  ? ? ? ?IMPRESSION: ?Chest Impression: ?  ?1. Interval enlargement of three RIGHT lung pulmonary nodules most ?consistent with progression of pulmonary metastatic disease (pseudo ?progression with immunotherapy considered). ?2. No metastatic adenopathy the thorax. ?3. No skeletal metastasis ?  ?Abdomen / Pelvis Impression: ?  ?1.  Post RIGHT nephrectomy without evidence local recurrence. ?2. Hepatic steatosis. ?3. No metastatic disease identified in the abdomen or pelvis. ?4. No immuno toxicities. ?  ? ? ?Impression and Plan: ? ? ?41 year old with: ? ?1.  Kidney cancer diagnosed in 2021.  He was found to have Sage IV clear-cell with pulmonary involvement.. ? ?CT scan obtained on June 01, 2021 was reviewed and showed enlargement of his pulmonary nodule that is likely represent disease progression.  Overall his volume of disease remain limited but certainly concerning for progression.  Treatment choices moving forward were discussed which include continuing current treatment, switching to Inlyta versus Lenvima with Pembrolizumab.  Risks and benefits of this choices were discussed at this time.  Complication associated with Inlyta that include hand-foot syndrome, hypertension and diarrhea were reiterated. ? ?After discussion today, we opted to keep with the same treatment regimen given the mild progression is noted.  His disease remains at a very slow pace without any new findings discovered.  He is agreeable and comfortable with this plan.  We will repeat CT scan in 3 months. ? ? ? ?2.  Immune mediated complications: He has not experienced any autoimmune complications at this time. ? ?3.  IV access: Port-A-Cath peripheral veins will continue to be in use at this time. ? ?4.  Antiemetics: Compazine is available to him without any nausea or vomiting. ? ?5.  Goals of care and prognosis discussion: Therapy remains palliative though aggressive measures are warranted at this time. ? ? ?6.  Elevated liver function test: Continues to fluctuate likely related to cabozantinib. ? ? ?7.  Follow-up: He will return next month for evaluation. ? ? ?30  minutes were spent on this encounter.  The time was dedicated to reviewing laboratory data, disease status update and outlining future plan of care discussion.  Immunotherapy with ? ? ?Zola Button,  MD ?4/5/202311:36 AM ? ? ? ? ?

## 2021-06-14 ENCOUNTER — Telehealth: Payer: Self-pay | Admitting: Oncology

## 2021-06-14 NOTE — Telephone Encounter (Signed)
Called patient regarding upcoming appointments, patient is notified. 

## 2021-06-23 ENCOUNTER — Telehealth: Payer: Self-pay | Admitting: *Deleted

## 2021-06-23 ENCOUNTER — Other Ambulatory Visit: Payer: Self-pay | Admitting: *Deleted

## 2021-06-23 DIAGNOSIS — C641 Malignant neoplasm of right kidney, except renal pelvis: Secondary | ICD-10-CM

## 2021-06-23 MED ORDER — CABOMETYX 20 MG PO TABS: ORAL_TABLET | ORAL | 1 refills | Status: DC

## 2021-06-23 NOTE — Telephone Encounter (Signed)
Gordon Thomas states he needs a refill of Cabometyx. States he and Dr Alen Blew discussed a possible change of treatment, but he thinks they decided for him to continue Cabometyx. ?

## 2021-07-04 ENCOUNTER — Inpatient Hospital Stay (HOSPITAL_BASED_OUTPATIENT_CLINIC_OR_DEPARTMENT_OTHER): Payer: BC Managed Care – PPO | Admitting: Oncology

## 2021-07-04 ENCOUNTER — Inpatient Hospital Stay: Payer: BC Managed Care – PPO | Attending: Oncology

## 2021-07-04 ENCOUNTER — Other Ambulatory Visit: Payer: Self-pay

## 2021-07-04 ENCOUNTER — Inpatient Hospital Stay: Payer: BC Managed Care – PPO

## 2021-07-04 VITALS — BP 141/97 | HR 92 | Temp 97.3°F | Resp 16 | Wt 167.0 lb

## 2021-07-04 VITALS — BP 129/86

## 2021-07-04 DIAGNOSIS — C641 Malignant neoplasm of right kidney, except renal pelvis: Secondary | ICD-10-CM

## 2021-07-04 DIAGNOSIS — R7989 Other specified abnormal findings of blood chemistry: Secondary | ICD-10-CM | POA: Insufficient documentation

## 2021-07-04 DIAGNOSIS — Z5112 Encounter for antineoplastic immunotherapy: Secondary | ICD-10-CM | POA: Diagnosis present

## 2021-07-04 DIAGNOSIS — C7802 Secondary malignant neoplasm of left lung: Secondary | ICD-10-CM | POA: Insufficient documentation

## 2021-07-04 DIAGNOSIS — Z905 Acquired absence of kidney: Secondary | ICD-10-CM | POA: Diagnosis not present

## 2021-07-04 DIAGNOSIS — C649 Malignant neoplasm of unspecified kidney, except renal pelvis: Secondary | ICD-10-CM | POA: Diagnosis present

## 2021-07-04 DIAGNOSIS — Z79899 Other long term (current) drug therapy: Secondary | ICD-10-CM | POA: Insufficient documentation

## 2021-07-04 LAB — CMP (CANCER CENTER ONLY)
ALT: 43 U/L (ref 0–44)
AST: 28 U/L (ref 15–41)
Albumin: 4.1 g/dL (ref 3.5–5.0)
Alkaline Phosphatase: 76 U/L (ref 38–126)
Anion gap: 8 (ref 5–15)
BUN: 11 mg/dL (ref 6–20)
CO2: 27 mmol/L (ref 22–32)
Calcium: 8.9 mg/dL (ref 8.9–10.3)
Chloride: 104 mmol/L (ref 98–111)
Creatinine: 1.41 mg/dL — ABNORMAL HIGH (ref 0.61–1.24)
GFR, Estimated: >60 mL/min (ref >60)
Glucose, Bld: 181 mg/dL — ABNORMAL HIGH (ref 70–99)
Potassium: 3.9 mmol/L (ref 3.5–5.1)
Sodium: 139 mmol/L (ref 135–145)
Total Bilirubin: 0.5 mg/dL (ref 0.3–1.2)
Total Protein: 7.5 g/dL (ref 6.5–8.1)

## 2021-07-04 LAB — CBC WITH DIFFERENTIAL (CANCER CENTER ONLY)
Abs Immature Granulocytes: 0.02 10*3/uL (ref 0.00–0.07)
Basophils Absolute: 0 10*3/uL (ref 0.0–0.1)
Basophils Relative: 0 %
Eosinophils Absolute: 0.2 10*3/uL (ref 0.0–0.5)
Eosinophils Relative: 2 %
HCT: 44.5 % (ref 39.0–52.0)
Hemoglobin: 15.9 g/dL (ref 13.0–17.0)
Immature Granulocytes: 0 %
Lymphocytes Relative: 30 %
Lymphs Abs: 2.7 10*3/uL (ref 0.7–4.0)
MCH: 33.3 pg (ref 26.0–34.0)
MCHC: 35.7 g/dL (ref 30.0–36.0)
MCV: 93.3 fL (ref 80.0–100.0)
Monocytes Absolute: 0.6 10*3/uL (ref 0.1–1.0)
Monocytes Relative: 6 %
Neutro Abs: 5.6 10*3/uL (ref 1.7–7.7)
Neutrophils Relative %: 62 %
Platelet Count: 278 10*3/uL (ref 150–400)
RBC: 4.77 MIL/uL (ref 4.22–5.81)
RDW: 12.9 % (ref 11.5–15.5)
WBC Count: 9.1 10*3/uL (ref 4.0–10.5)
nRBC: 0 % (ref 0.0–0.2)

## 2021-07-04 LAB — TSH: TSH: 4.212 u[IU]/mL (ref 0.350–4.500)

## 2021-07-04 MED ORDER — SODIUM CHLORIDE 0.9 % IV SOLN
480.0000 mg | Freq: Once | INTRAVENOUS | Status: AC
  Administered 2021-07-04: 480 mg via INTRAVENOUS
  Filled 2021-07-04: qty 48

## 2021-07-04 MED ORDER — SODIUM CHLORIDE 0.9 % IV SOLN: Freq: Once | INTRAVENOUS | Status: AC

## 2021-07-04 NOTE — Progress Notes (Signed)
Hematology and Oncology Follow Up Visit ? ?Gordon Thomas ?[id number] ?1980-07-12 41 y.o. ?07/04/2021 8:07 AM ?[name], MDShelton, [name], MD  ? 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   ? ? ?Prior Therapy: ?He is status post right radical nephrectomy completed on June 12, 2019.  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 ? ?Ipilimumab 1 mg/kg and nivolumab 3 mg/kg given every 3 weeks. Cycle 1 given on March 01, 2020. He completed 4 cycles of therapy on May 04, 2020. ? ?Current therapy: Nivolumab 480 mg every 4 weeks to start on March 30th 2022.  Cabozantinib 40 mg started on June 10, 2020.   His dose was reduced to 20 mg in May 2022.  He is here for the next cycle of therapy. ? ?Interim History: Gordon Thomas presents today for repeat follow-up.  Since last visit, he reports no major changes in his health.  He has tolerated the last cycle of therapy without any major complaints.  He denies any nausea, vomiting or abdominal pain.  He denies any recent hospitalizations or illnesses.  He denies any skin rash, lesions or diarrhea.  He denies any respiratory complaints.  His performance status and quality of life remains unchanged. ? ? ? ? ?Medications: Reviewed without changes. ?Current Outpatient Medications  ?Medication Sig Dispense Refill  ? albuterol (VENTOLIN HFA) 108 (90 Base) MCG/ACT inhaler Inhale 1-2 puffs into the lungs every 6 (six) hours as needed for wheezing or shortness of breath.    ? cabozantinib (CABOMETYX) 20 MG tablet TAKE 1 TABLET DAILY ON AN EMPTY STOMACH, 1 HOUR BEFORE OR 2 HOURS AFTER A MEAL 30 tablet 1  ? docusate sodium (COLACE) 100 MG capsule Take 1 capsule (100 mg total) by mouth 2 (two) times daily.    ? prochlorperazine (COMPAZINE) 10 MG tablet Take 1 tablet (10 mg total) by mouth every 6 (six) hours as needed for nausea or vomiting. 30  tablet 0  ? promethazine (PHENERGAN) 12.5 MG tablet Take 1 tablet (12.5 mg total) by mouth every 4 (four) hours as needed for nausea or vomiting. 15 tablet 0  ? ?No current facility-administered medications for this visit.  ? ? ? ?Allergies: No Known Allergies ? ? ? ?Physical Exam: ? ? ?Blood pressure (!) 141/97, pulse 92, temperature (!) 97.3 ?F (36.3 ?C), temperature source Temporal, resp. rate 16, weight 167 lb (75.8 kg), SpO2 100 %. ? ? ? ? ? ?ECOG:  0 ?  ? ?General appearance: Comfortable appearing without any discomfort ?Head: Normocephalic without any trauma ?Oropharynx: Mucous membranes are moist and pink without any thrush or ulcers. ?Eyes: Pupils are equal and round reactive to light. ?Lymph nodes: No cervical, supraclavicular, inguinal or axillary lymphadenopathy.   ?Heart:regular rate and rhythm.  S1 and S2 without leg edema. ?Lung: Clear without any rhonchi or wheezes.  No dullness to percussion. ?Abdomin: Soft, nontender, nondistended with good bowel sounds.  No hepatosplenomegaly. ?Musculoskeletal: No joint deformity or effusion.  Full range of motion noted. ?Neurological: No deficits noted on motor, sensory and deep tendon reflex exam. ?Skin: No petechial rash or dryness.  Appeared moist.  ? ? ? ? ? ? ? ? ? ? ? ? ? ? ? ? ? ?Lab Results: ?Lab Results  ?Component Value Date  ? WBC 8.5 06/07/2021  ? HGB 15.7 06/07/2021  ? HCT 44.6 06/07/2021  ? MCV 91.8 06/07/2021  ? PLT  299 06/07/2021  ? ?  Chemistry   ?   ?Component Value Date/Time  ? NA 137 06/07/2021 1125  ? K 4.0 06/07/2021 1125  ? CL 103 06/07/2021 1125  ? CO2 28 06/07/2021 1125  ? BUN 13 06/07/2021 1125  ? CREATININE 1.28 (H) 06/07/2021 1125  ?    ?Component Value Date/Time  ? CALCIUM 9.2 06/07/2021 1125  ? ALKPHOS 73 06/07/2021 1125  ? AST 43 (H) 06/07/2021 1125  ? ALT 60 (H) 06/07/2021 1125  ? BILITOT 0.3 06/07/2021 1125  ?  ? ? ? ? ? ? ?Impression and Plan: ? ? ?41 year old with: ? ?1.  Stage IV clear-cell renal cell carcinoma with pulmonary  involvement documented in 2021 ? ?He is currently on Cabometyx with nivolumab which has tolerated very well.  Risks and benefits of continuing this treatment were reviewed again.  Alternative treatment options including Lenvima with Pembrolizumab versus axitinib were also discussed.  The plan is to update his staging scan in 2 months and make any changes accordingly.  He is agreeable to proceed. ? ? ? ?2.  Immune mediated complications: I continue to educate him about potential complications including pneumonitis, colitis and thyroid disease. ? ?3.  IV access: No issues reported with peripheral vein use. ? ?4.  Antiemetics: Compazine is available to him without any nausea or vomiting. ? ?5.  Goals of care and prognosis discussion: His disease is incurable although aggressive measures are warranted given his excellent performance status and young age. ? ? ?6.  Elevated liver function test: Continues to be mildly elevated but overall while under reasonable control. ? ?7.  Follow-up: In 4 weeks for the next cycle of therapy. ? ? ?30  minutes were dedicated to this visit.  The time was spent on reviewing laboratory data, disease status update, addressing complications related cancer and cancer therapies. ? ? ?Zola Button, MD ?5/2/20238:07 AM ? ? ? ? ?

## 2021-07-04 NOTE — Patient Instructions (Signed)
[facility]  Discharge Instructions: Thank you for choosing [facility] to provide your oncology and hematology care.   If you have a lab appointment with the Cancer Center, please go directly to the Cancer Center and check in at the registration area.   Wear comfortable clothing and clothing appropriate for easy access to any Portacath or PICC line.   We strive to give you quality time with your provider. You may need to reschedule your appointment if you arrive late (15 or more minutes).  Arriving late affects you and other patients whose appointments are after yours.  Also, if you miss three or more appointments without notifying the office, you may be dismissed from the clinic at the provider's discretion.      For prescription refill requests, have your pharmacy contact our office and allow 72 hours for refills to be completed.    Today you received the following chemotherapy and/or immunotherapy agents: Nivolumab.      To help prevent nausea and vomiting after your treatment, we encourage you to take your nausea medication as directed.  BELOW ARE SYMPTOMS THAT SHOULD BE REPORTED IMMEDIATELY: *FEVER GREATER THAN 100.4 F (38 C) OR HIGHER *CHILLS OR SWEATING *NAUSEA AND VOMITING THAT IS NOT CONTROLLED WITH YOUR NAUSEA MEDICATION *UNUSUAL SHORTNESS OF BREATH *UNUSUAL BRUISING OR BLEEDING *URINARY PROBLEMS (pain or burning when urinating, or frequent urination) *BOWEL PROBLEMS (unusual diarrhea, constipation, pain near the anus) TENDERNESS IN MOUTH AND THROAT WITH OR WITHOUT PRESENCE OF ULCERS (sore throat, sores in mouth, or a toothache) UNUSUAL RASH, SWELLING OR PAIN  UNUSUAL VAGINAL DISCHARGE OR ITCHING   Items with * indicate a potential emergency and should be followed up as soon as possible or go to the Emergency Department if any problems should occur.  Please show the CHEMOTHERAPY ALERT CARD or IMMUNOTHERAPY ALERT CARD at check-in to  the Emergency Department and triage nurse.  Should you have questions after your visit or need to cancel or reschedule your appointment, please contact [facility]  Dept: [phone]  and follow the prompts.  Office hours are 8:00 a.m. to 4:30 p.m. Monday - Friday. Please note that voicemails left after 4:00 p.m. may not be returned until the following business day.  We are closed weekends and major holidays. You have access to a nurse at all times for urgent questions. Please call the main number to the clinic Dept: [phone] and follow the prompts.   For any non-urgent questions, you may also contact your provider using MyChart. We now offer e-Visits for anyone 18 and older to request care online for non-urgent symptoms. For details visit [email or web address].   Also download the MyChart app! Go to the app store, search "MyChart", open the app, select , and log in with your MyChart username and password.  Due to Covid, a mask is required upon entering the hospital/clinic. If you do not have a mask, one will be given to you upon arrival. For doctor visits, patients may have 1 support person aged 18 or older with them. For treatment visits, patients cannot have anyone with them due to current Covid guidelines and our immunocompromised population.   

## 2021-08-01 ENCOUNTER — Other Ambulatory Visit: Payer: Self-pay | Admitting: Oncology

## 2021-08-01 DIAGNOSIS — C641 Malignant neoplasm of right kidney, except renal pelvis: Secondary | ICD-10-CM

## 2021-08-02 ENCOUNTER — Inpatient Hospital Stay (HOSPITAL_BASED_OUTPATIENT_CLINIC_OR_DEPARTMENT_OTHER): Payer: BC Managed Care – PPO | Admitting: Oncology

## 2021-08-02 ENCOUNTER — Inpatient Hospital Stay: Payer: BC Managed Care – PPO

## 2021-08-02 ENCOUNTER — Other Ambulatory Visit: Payer: Self-pay

## 2021-08-02 VITALS — BP 148/93 | HR 89 | Temp 97.0°F | Resp 17 | Wt 167.4 lb

## 2021-08-02 DIAGNOSIS — C641 Malignant neoplasm of right kidney, except renal pelvis: Secondary | ICD-10-CM

## 2021-08-02 DIAGNOSIS — C649 Malignant neoplasm of unspecified kidney, except renal pelvis: Secondary | ICD-10-CM | POA: Diagnosis not present

## 2021-08-02 LAB — CBC WITH DIFFERENTIAL (CANCER CENTER ONLY)
Abs Immature Granulocytes: 0.01 10*3/uL (ref 0.00–0.07)
Basophils Absolute: 0 10*3/uL (ref 0.0–0.1)
Basophils Relative: 0 %
Eosinophils Absolute: 0.3 10*3/uL (ref 0.0–0.5)
Eosinophils Relative: 5 %
HCT: 43.7 % (ref 39.0–52.0)
Hemoglobin: 15.8 g/dL (ref 13.0–17.0)
Immature Granulocytes: 0 %
Lymphocytes Relative: 50 %
Lymphs Abs: 3.3 10*3/uL (ref 0.7–4.0)
MCH: 33 pg (ref 26.0–34.0)
MCHC: 36.2 g/dL — ABNORMAL HIGH (ref 30.0–36.0)
MCV: 91.2 fL (ref 80.0–100.0)
Monocytes Absolute: 0.4 10*3/uL (ref 0.1–1.0)
Monocytes Relative: 6 %
Neutro Abs: 2.6 10*3/uL (ref 1.7–7.7)
Neutrophils Relative %: 39 %
Platelet Count: 290 10*3/uL (ref 150–400)
RBC: 4.79 MIL/uL (ref 4.22–5.81)
RDW: 12.8 % (ref 11.5–15.5)
WBC Count: 6.7 10*3/uL (ref 4.0–10.5)
nRBC: 0 % (ref 0.0–0.2)

## 2021-08-02 LAB — CMP (CANCER CENTER ONLY)
ALT: 46 U/L — ABNORMAL HIGH (ref 0–44)
AST: 35 U/L (ref 15–41)
Albumin: 4 g/dL (ref 3.5–5.0)
Alkaline Phosphatase: 87 U/L (ref 38–126)
Anion gap: 6 (ref 5–15)
BUN: 13 mg/dL (ref 6–20)
CO2: 29 mmol/L (ref 22–32)
Calcium: 9.5 mg/dL (ref 8.9–10.3)
Chloride: 105 mmol/L (ref 98–111)
Creatinine: 1.29 mg/dL — ABNORMAL HIGH (ref 0.61–1.24)
GFR, Estimated: >60 mL/min (ref >60)
Glucose, Bld: 147 mg/dL — ABNORMAL HIGH (ref 70–99)
Potassium: 3.7 mmol/L (ref 3.5–5.1)
Sodium: 140 mmol/L (ref 135–145)
Total Bilirubin: 0.4 mg/dL (ref 0.3–1.2)
Total Protein: 7.3 g/dL (ref 6.5–8.1)

## 2021-08-02 LAB — TSH: TSH: 5.94 u[IU]/mL — ABNORMAL HIGH (ref 0.350–4.500)

## 2021-08-02 MED ORDER — SODIUM CHLORIDE 0.9 % IV SOLN: Freq: Once | INTRAVENOUS | Status: AC

## 2021-08-02 MED ORDER — SODIUM CHLORIDE 0.9 % IV SOLN
480.0000 mg | Freq: Once | INTRAVENOUS | Status: AC
  Administered 2021-08-02: 480 mg via INTRAVENOUS
  Filled 2021-08-02: qty 48

## 2021-08-02 NOTE — Progress Notes (Signed)
Hematology and Oncology Follow Up Visit  Bienvenido Proehl [id number] [birth year] 41 y.o. 08/02/2021 9:23 AM [name], MDShelton, [name], MD   Principle Diagnosis: 41 year old man with kidney cancer diagnosed in April 2021.  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   Prior Therapy: He is status post right radical nephrectomy completed on June 12, 2019.  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  Ipilimumab 1 mg/kg and nivolumab 3 mg/kg given every 3 weeks. Cycle 1 given on March 01, 2020. He completed 4 cycles of therapy on May 04, 2020.  Current therapy: Nivolumab 480 mg every 4 weeks to start on March 30th 2022.  Cabozantinib 40 mg started on June 10, 2020.   His dose was reduced to 20 mg in May 2022.  He is here for the next cycle of therapy.  Interim History: Mr. Stern returns today for repeat evaluation.  Since last visit, he reports feeling well without any major complaints.  He tolerated the last cycle of therapy without any issues.  He denies any nausea, vomiting or abdominal pain.  He denies any diarrhea or weight loss.  He denies any skin rashes or lesions.     Medications: updated on review. Current Outpatient Medications  Medication Sig Dispense Refill   albuterol (VENTOLIN HFA) 108 (90 Base) MCG/ACT inhaler Inhale 1-2 puffs into the lungs every 6 (six) hours as needed for wheezing or shortness of breath.     CABOMETYX 20 MG tablet TAKE 1 TABLET DAILY ON AN EMPTY STOMACH, 1 HOUR BEFORE OR 2 HOURS AFTER A MEAL 30 tablet 1   docusate sodium (COLACE) 100 MG capsule Take 1 capsule (100 mg total) by mouth 2 (two) times daily.     prochlorperazine (COMPAZINE) 10 MG tablet Take 1 tablet (10 mg total) by mouth every 6 (six) hours as needed for nausea or vomiting. 30 tablet 0   promethazine (PHENERGAN) 12.5 MG tablet Take 1 tablet (12.5 mg total) by mouth every 4 (four) hours  as needed for nausea or vomiting. 15 tablet 0   No current facility-administered medications for this visit.     Allergies: No Known Allergies    Physical Exam:     Blood pressure (!) 148/93, pulse 89, temperature (!) 97 F (36.1 C), temperature source Temporal, resp. rate 17, weight 167 lb 6.4 oz (75.9 kg), SpO2 100 %.     ECOG:  0    General appearance: Alert, awake without any distress. Head: Atraumatic without abnormalities Oropharynx: Without any thrush or ulcers. Eyes: No scleral icterus. Lymph nodes: No lymphadenopathy noted in the cervical, supraclavicular, or axillary nodes Heart:regular rate and rhythm, without any murmurs or gallops.   Lung: Clear to auscultation without any rhonchi, wheezes or dullness to percussion. Abdomin: Soft, nontender without any shifting dullness or ascites. Musculoskeletal: No clubbing or cyanosis. Neurological: No motor or sensory deficits. Skin: No rashes or lesions.                   Lab Results: Lab Results  Component Value Date   WBC 9.1 07/04/2021   HGB 15.9 07/04/2021   HCT 44.5 07/04/2021   MCV 93.3 07/04/2021   PLT 278 07/04/2021     Chemistry      Component Value Date/Time   NA 139 07/04/2021 0803   K 3.9 07/04/2021 0803   CL 104 07/04/2021 0803   CO2 27 07/04/2021 0803   BUN 11  07/04/2021 0803   CREATININE 1.41 (H) 07/04/2021 0803      Component Value Date/Time   CALCIUM 8.9 07/04/2021 0803   ALKPHOS 76 07/04/2021 0803   AST 28 07/04/2021 0803   ALT 43 07/04/2021 0803   BILITOT 0.5 07/04/2021 0803          Impression and Plan:   75 year old with:  1.  Kidney cancer diagnosed in 2021.  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  He continues to tolerate current therapy without any major complications.  Plan is to update his staging  scan before the next visit.  Different salvage therapy options including axitinib versus Lenvima and other choices were reiterated.  He is agreeable to proceed for today's treatment.  He is agreeable to proceed.    2.  Immune mediated complications: No complications noted at this time.  I continue to educate him on pneumonitis, colitis and thyroid disease.  3.  IV access: Peripheral veins continue to be in use at this time.  4.  Antiemetics: No nausea or vomiting reported at this time.  Compazine is available to him.  5.  Goals of care and prognosis discussion: Therapy remains palliative although aggressive measures are warranted at this time.   6.  Elevated liver function test: Laboratory data on May 2 showed normal liver function tests and will continue to monitor on subsequent treatments.   7.  Follow-up: We will return next month for the next cycle of therapy.   30  minutes were spent on this encounter.  The time was dedicated to reviewing laboratory data, disease status update and outlining future plan of care discussion.  Zola Button, MD 5/31/20239:23 AM

## 2021-08-02 NOTE — Patient Instructions (Signed)
[facility]ICAL [facility]COLOG[facility]  Discharge Instructions: Thank you for choosing [facility] to provide your oncology and hematology care.   If you have a lab appointment with the Cancer Center, please go directly to the Cancer Center and check in at the registration area.   Wear comfortable clothing and clothing appropriate for easy access to any Portacath or PICC line.   We strive to give you quality time with your provider. You may need to reschedule your appointment if you arrive late (15 or more minutes).  Arriving late affects you and other patients whose appointments are after yours.  Also, if you miss three or more appointments without notifying the office, you may be dismissed from the clinic at the provider's discretion.      For prescription refill requests, have your pharmacy contact our office and allow 72 hours for refills to be completed.    Today you received the following chemotherapy and/or immunotherapy agents: Nivolumab.      To help prevent nausea and vomiting after your treatment, we encourage you to take your nausea medication as directed.  BELOW ARE SYMPTOMS THAT SHOULD BE REPORTED IMMEDIATELY: *FEVER GREATER THAN 100.4 F (38 C) OR HIGHER *CHILLS OR SWEATING *NAUSEA AND VOMITING THAT IS NOT CONTROLLED WITH YOUR NAUSEA MEDICATION *UNUSUAL SHORTNESS OF BREATH *UNUSUAL BRUISING OR BLEEDING *URINARY PROBLEMS (pain or burning when urinating, or frequent urination) *BOWEL PROBLEMS (unusual diarrhea, constipation, pain near the anus) TENDERNESS IN MOUTH AND THROAT WITH OR WITHOUT PRESENCE OF ULCERS (sore throat, sores in mouth, or a toothache) UNUSUAL RASH, SWELLING OR PAIN  UNUSUAL VAGINAL DISCHARGE OR ITCHING   Items with * indicate a potential emergency and should be followed up as soon as possible or go to the Emergency Department if any problems should occur.  Please show the CHEMOTHERAPY ALERT CARD or IMMUNOTHERAPY ALERT CARD at check-in to  the Emergency Department and triage nurse.  Should you have questions after your visit or need to cancel or reschedule your appointment, please contact [facility]  Dept: [phone]  and follow the prompts.  Office hours are 8:00 a.m. to 4:30 p.m. Monday - Friday. Please note that voicemails left after 4:00 p.m. may not be returned until the following business day.  We are closed weekends and major holidays. You have access to a nurse at all times for urgent questions. Please call the main number to the clinic Dept: [phone] and follow the prompts.   For any non-urgent questions, you may also contact your provider using MyChart. We now offer e-Visits for anyone 18 and older to request care online for non-urgent symptoms. For details visit [email or web address].   Also download the MyChart app! Go to the app store, search "MyChart", open the app, select [facility], and log in with your MyChart username and password.  Due to Covid, a mask is required upon entering the hospital/clinic. If you do not have a mask, one will be given to you upon arrival. For doctor visits, patients may have 1 support person aged 18 or older with them. For treatment visits, patients cannot have anyone with them due to current Covid guidelines and our immunocompromised population.   

## 2021-08-17 ENCOUNTER — Ambulatory Visit (HOSPITAL_COMMUNITY)
Admission: RE | Admit: 2021-08-17 | Discharge: 2021-08-17 | Disposition: A | Discharge status: Home / Self Care | Payer: BC Managed Care – PPO | Source: Ambulatory Visit | Attending: Oncology | Admitting: Oncology

## 2021-08-17 DIAGNOSIS — C641 Malignant neoplasm of right kidney, except renal pelvis: Secondary | ICD-10-CM

## 2021-08-29 ENCOUNTER — Inpatient Hospital Stay: Payer: BC Managed Care – PPO | Attending: Oncology

## 2021-08-29 ENCOUNTER — Telehealth: Payer: Self-pay

## 2021-08-29 ENCOUNTER — Inpatient Hospital Stay: Payer: BC Managed Care – PPO

## 2021-08-29 ENCOUNTER — Inpatient Hospital Stay (HOSPITAL_BASED_OUTPATIENT_CLINIC_OR_DEPARTMENT_OTHER): Payer: BC Managed Care – PPO | Admitting: Oncology

## 2021-08-29 ENCOUNTER — Telehealth: Payer: Self-pay | Admitting: Pharmacy Technician

## 2021-08-29 ENCOUNTER — Other Ambulatory Visit: Payer: Self-pay

## 2021-08-29 ENCOUNTER — Other Ambulatory Visit (HOSPITAL_COMMUNITY): Payer: Self-pay

## 2021-08-29 VITALS — BP 137/99 | HR 86 | Temp 97.9°F | Resp 17 | Ht 65.0 in | Wt 165.2 lb

## 2021-08-29 DIAGNOSIS — C641 Malignant neoplasm of right kidney, except renal pelvis: Secondary | ICD-10-CM | POA: Diagnosis not present

## 2021-08-29 DIAGNOSIS — C7802 Secondary malignant neoplasm of left lung: Secondary | ICD-10-CM | POA: Insufficient documentation

## 2021-08-29 DIAGNOSIS — R7989 Other specified abnormal findings of blood chemistry: Secondary | ICD-10-CM | POA: Insufficient documentation

## 2021-08-29 DIAGNOSIS — C649 Malignant neoplasm of unspecified kidney, except renal pelvis: Secondary | ICD-10-CM | POA: Insufficient documentation

## 2021-08-29 DIAGNOSIS — Z905 Acquired absence of kidney: Secondary | ICD-10-CM | POA: Diagnosis not present

## 2021-08-29 DIAGNOSIS — Z79899 Other long term (current) drug therapy: Secondary | ICD-10-CM | POA: Diagnosis not present

## 2021-08-29 LAB — CBC WITH DIFFERENTIAL (CANCER CENTER ONLY)
Abs Immature Granulocytes: 0.01 10*3/uL (ref 0.00–0.07)
Basophils Absolute: 0 10*3/uL (ref 0.0–0.1)
Basophils Relative: 0 %
Eosinophils Absolute: 0.3 10*3/uL (ref 0.0–0.5)
Eosinophils Relative: 4 %
HCT: 45 % (ref 39.0–52.0)
Hemoglobin: 16.3 g/dL (ref 13.0–17.0)
Immature Granulocytes: 0 %
Lymphocytes Relative: 46 %
Lymphs Abs: 3.3 10*3/uL (ref 0.7–4.0)
MCH: 33.1 pg (ref 26.0–34.0)
MCHC: 36.2 g/dL — ABNORMAL HIGH (ref 30.0–36.0)
MCV: 91.5 fL (ref 80.0–100.0)
Monocytes Absolute: 0.5 10*3/uL (ref 0.1–1.0)
Monocytes Relative: 7 %
Neutro Abs: 3.1 10*3/uL (ref 1.7–7.7)
Neutrophils Relative %: 43 %
Platelet Count: 278 10*3/uL (ref 150–400)
RBC: 4.92 MIL/uL (ref 4.22–5.81)
RDW: 13.2 % (ref 11.5–15.5)
WBC Count: 7.3 10*3/uL (ref 4.0–10.5)
nRBC: 0 % (ref 0.0–0.2)

## 2021-08-29 LAB — CMP (CANCER CENTER ONLY)
ALT: 44 U/L (ref 0–44)
AST: 38 U/L (ref 15–41)
Albumin: 4.1 g/dL (ref 3.5–5.0)
Alkaline Phosphatase: 85 U/L (ref 38–126)
Anion gap: 5 (ref 5–15)
BUN: 11 mg/dL (ref 6–20)
CO2: 27 mmol/L (ref 22–32)
Calcium: 9 mg/dL (ref 8.9–10.3)
Chloride: 106 mmol/L (ref 98–111)
Creatinine: 1.19 mg/dL (ref 0.61–1.24)
GFR, Estimated: >60 mL/min (ref >60)
Glucose, Bld: 93 mg/dL (ref 70–99)
Potassium: 4 mmol/L (ref 3.5–5.1)
Sodium: 138 mmol/L (ref 135–145)
Total Bilirubin: 0.4 mg/dL (ref 0.3–1.2)
Total Protein: 7.3 g/dL (ref 6.5–8.1)

## 2021-08-29 MED ORDER — AXITINIB 5 MG PO TABS
5.0000 mg | ORAL_TABLET | Freq: Two times a day (BID) | ORAL | 1 refills | Status: DC
  Filled 2021-08-29: qty 60, 30d supply, fill #0
  Filled 2021-08-30: qty 30, 15d supply, fill #0
  Filled 2021-09-06: qty 30, 15d supply, fill #1
  Filled 2021-10-06: qty 30, 15d supply, fill #2
  Filled 2021-11-01: qty 30, 15d supply, fill #3

## 2021-08-29 NOTE — Telephone Encounter (Signed)
Oral Oncology Pharmacist Encounter  Received new prescription for axitinib (Inlyta) for the treatment of RCC, planned duration until disease progression or unacceptable toxicity.  Labs from 08/29/21 (CBC/ CMP) and 08/02/21 (TSH) assessed, no interventions needed. Prescription dose and frequency assessed.  Current medication list in Epic reviewed, DDIs with Inlyta identified: none  Evaluated chart and no patient barriers to medication adherence noted.   Patient agreement for treatment documented in MD note on 08/29/21.  Prescription has been e-scribed to the [facility] for benefits analysis and approval.  Oral Oncology Clinic will continue to follow for insurance authorization, copayment issues, initial counseling and start date.  Bethel Born, PharmD Hematology/Oncology Clinical Pharmacist [facility] Oral Chemotherapy Navigation Clinic [phone] 08/29/2021 9:29 AM

## 2021-08-29 NOTE — Telephone Encounter (Signed)
Oral Oncology Patient Advocate Encounter   Received notification that prior authorization for Inlyta is required.   PA submitted on 08/29/2021 Key [id number] Status is pending     Jinger Neighbors, CPhT-Adv Pharmacy Patient Advocate Specialist [facility] Patient Advocate Team Direct Number: [phone]  Fax: [phone]

## 2021-08-30 ENCOUNTER — Other Ambulatory Visit (HOSPITAL_COMMUNITY): Payer: Self-pay

## 2021-08-30 NOTE — Telephone Encounter (Signed)
Oral Chemotherapy Pharmacist Encounter  I spoke with patient for overview of: Inlyta (axitinib) for the treatment of previously treated, advanced or metastatic renal cell carcinoma, planned duration until disease progression or unacceptable toxicity.   Counseled patient on administration, dosing, side effects, monitoring, drug-food interactions, safe handling, storage, and disposal.  Patient will take Inlyta '5mg'$  tablets, 1 tablet by mouth twice daily, without regard to food, with a glass of water.  Patient instructed to avoid grapefruit and grapefruit juice while on therapy with Inlyta.  Inlyta start date: 09/01/21 Patient instructed to stop taking the Cabometyx tomorrow (6/29) and start Inlyta on Friday. Patient understands and agrees to plan.  Adverse effects include but are not limited to: hypertension, hand-foot syndrome, nausea, vomiting, diarrhea, fatigue, dysphonia (hoarseness), and abnormal laboratory values.   Patient will obtain anti diarrheal and alert the office of 4 or more loose stools above baseline.  Inlyta will be held at least 24 hours prior to surgery and resumed at discretion of treating physician based on wound healing  Reviewed with patient importance of keeping a medication schedule and plan for any missed doses. No barriers to medication adherence identified.  Medication reconciliation performed and medication/allergy list updated.  Insurance authorization for Bartholomew Boards has been obtained. Test claim at the pharmacy revealed copayment $0 for 1st fill of 30 days due to [facility]. This will ship from the [facility] on 08/30/21 to deliver to patient's home on 08/31/21.  Patient informed the pharmacy will reach out 5-7 days prior to needing next fill of Inlyta to coordinate continued medication acquisition to prevent break in therapy.  All questions answered.  Mr. Hunzeker voiced understanding and appreciation.   Medication education handout  placed in mail for patient. Patient knows to call the office with questions or concerns. Oral Chemotherapy Clinic phone number provided to patient.   Drema Halon, PharmD Hematology/Oncology Clinical Pharmacist [facility] [phone] 08/30/2021   12:57 PM

## 2021-09-01 ENCOUNTER — Other Ambulatory Visit (HOSPITAL_COMMUNITY): Payer: Self-pay

## 2021-09-06 ENCOUNTER — Other Ambulatory Visit (HOSPITAL_COMMUNITY): Payer: Self-pay

## 2021-09-08 ENCOUNTER — Telehealth: Payer: Self-pay | Admitting: Oncology

## 2021-09-08 NOTE — Telephone Encounter (Signed)
Called patient regarding upcoming August appointment, patient has been called and notified.  

## 2021-09-11 ENCOUNTER — Other Ambulatory Visit (HOSPITAL_COMMUNITY): Payer: Self-pay

## 2021-09-19 ENCOUNTER — Telehealth: Payer: Self-pay | Admitting: *Deleted

## 2021-09-19 NOTE — Telephone Encounter (Signed)
LM to hold Inlyta until next office visit on 8/3. To call if any questions

## 2021-09-19 NOTE — Telephone Encounter (Signed)
Gordon Thomas states he started having trouble with sores on his feet a few days after starting Inlyta. "I tried to tough it out, but it is getting worse. Previous sores are coming back as well as some new ones, having difficulty walking"

## 2021-09-20 ENCOUNTER — Other Ambulatory Visit (HOSPITAL_COMMUNITY): Payer: Self-pay

## 2021-10-03 ENCOUNTER — Telehealth: Payer: Self-pay | Admitting: *Deleted

## 2021-10-03 NOTE — Telephone Encounter (Signed)
-----   Message from Wyatt Portela, MD sent at 10/03/2021  3:47 PM EDT ----- I do not recommend anything beyond Tylenol.  His hydration and rest. ----- Message ----- From: Rolene Course, RN Sent: 10/03/2021   3:45 PM EDT To: Wyatt Portela, MD  Gordon Thomas called this afternoon - he reports a fever of 100 today, states he inhaled some cold air today & his lungs "feel funny."  He denies SOB, states this has happened to him before.  He has had some slight dizziness & a HA.  I advised him to take tylenol for his fever, he is asking if there is something OTC you would recommend.  Please advise.  Thanks, Bethena Roys

## 2021-10-03 NOTE — Telephone Encounter (Signed)
Returned PC to patient, informed him of Dr Hazeline Junker advice as below, patient also instructed to call this office to report any additional sx's that occur before his appointment on 10/05/21.  He verbalizes understanding.

## 2021-10-05 ENCOUNTER — Other Ambulatory Visit: Payer: Self-pay

## 2021-10-05 ENCOUNTER — Inpatient Hospital Stay: Payer: BC Managed Care – PPO | Attending: Oncology

## 2021-10-05 ENCOUNTER — Inpatient Hospital Stay (HOSPITAL_BASED_OUTPATIENT_CLINIC_OR_DEPARTMENT_OTHER): Payer: BC Managed Care – PPO | Admitting: Oncology

## 2021-10-05 VITALS — BP 156/90 | HR 103 | Temp 100.1°F | Resp 18 | Ht 65.0 in | Wt 159.3 lb

## 2021-10-05 DIAGNOSIS — C641 Malignant neoplasm of right kidney, except renal pelvis: Secondary | ICD-10-CM

## 2021-10-05 DIAGNOSIS — C649 Malignant neoplasm of unspecified kidney, except renal pelvis: Secondary | ICD-10-CM | POA: Insufficient documentation

## 2021-10-05 DIAGNOSIS — R7989 Other specified abnormal findings of blood chemistry: Secondary | ICD-10-CM | POA: Insufficient documentation

## 2021-10-05 DIAGNOSIS — Z905 Acquired absence of kidney: Secondary | ICD-10-CM | POA: Diagnosis not present

## 2021-10-05 DIAGNOSIS — Z79899 Other long term (current) drug therapy: Secondary | ICD-10-CM | POA: Diagnosis not present

## 2021-10-05 DIAGNOSIS — C7802 Secondary malignant neoplasm of left lung: Secondary | ICD-10-CM | POA: Diagnosis present

## 2021-10-05 DIAGNOSIS — J329 Chronic sinusitis, unspecified: Secondary | ICD-10-CM | POA: Diagnosis not present

## 2021-10-05 LAB — CBC WITH DIFFERENTIAL (CANCER CENTER ONLY)
Abs Immature Granulocytes: 0.03 10*3/uL (ref 0.00–0.07)
Basophils Absolute: 0 10*3/uL (ref 0.0–0.1)
Basophils Relative: 1 %
Eosinophils Absolute: 0.1 10*3/uL (ref 0.0–0.5)
Eosinophils Relative: 2 %
HCT: 42.1 % (ref 39.0–52.0)
Hemoglobin: 14.7 g/dL (ref 13.0–17.0)
Immature Granulocytes: 0 %
Lymphocytes Relative: 32 %
Lymphs Abs: 2.6 10*3/uL (ref 0.7–4.0)
MCH: 32.7 pg (ref 26.0–34.0)
MCHC: 34.9 g/dL (ref 30.0–36.0)
MCV: 93.6 fL (ref 80.0–100.0)
Monocytes Absolute: 0.7 10*3/uL (ref 0.1–1.0)
Monocytes Relative: 9 %
Neutro Abs: 4.6 10*3/uL (ref 1.7–7.7)
Neutrophils Relative %: 56 %
Platelet Count: 293 10*3/uL (ref 150–400)
RBC: 4.5 MIL/uL (ref 4.22–5.81)
RDW: 12.7 % (ref 11.5–15.5)
WBC Count: 8.1 10*3/uL (ref 4.0–10.5)
nRBC: 0 % (ref 0.0–0.2)

## 2021-10-05 LAB — CMP (CANCER CENTER ONLY)
ALT: 34 U/L (ref 0–44)
AST: 29 U/L (ref 15–41)
Albumin: 4.2 g/dL (ref 3.5–5.0)
Alkaline Phosphatase: 80 U/L (ref 38–126)
Anion gap: 7 (ref 5–15)
BUN: 13 mg/dL (ref 6–20)
CO2: 28 mmol/L (ref 22–32)
Calcium: 8.7 mg/dL — ABNORMAL LOW (ref 8.9–10.3)
Chloride: 101 mmol/L (ref 98–111)
Creatinine: 1.53 mg/dL — ABNORMAL HIGH (ref 0.61–1.24)
GFR, Estimated: 59 mL/min — ABNORMAL LOW (ref >60)
Glucose, Bld: 177 mg/dL — ABNORMAL HIGH (ref 70–99)
Potassium: 3.8 mmol/L (ref 3.5–5.1)
Sodium: 136 mmol/L (ref 135–145)
Total Bilirubin: 0.4 mg/dL (ref 0.3–1.2)
Total Protein: 7.8 g/dL (ref 6.5–8.1)

## 2021-10-05 MED ORDER — AZITHROMYCIN 250 MG PO TABS: ORAL_TABLET | ORAL | 0 refills | Status: DC

## 2021-10-05 NOTE — Progress Notes (Signed)
Hematology and Oncology Follow Up Visit  Gordon Thomas [id number] 12-Jan-1981 41 y.o. 10/05/2021 12:37 PM Gordon Thomas, MDShelton, Gordon Millin, MD   Principle Diagnosis: 41 year old man with kidney cancer diagnosed in April 2021.  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   Prior Therapy: He is status post right radical nephrectomy completed on June 12, 2019.  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  Ipilimumab 1 mg/kg and nivolumab 3 mg/kg given every 3 weeks. Cycle 1 given on March 01, 2020. He completed 4 cycles of therapy on May 04, 2020.   Nivolumab 480 mg every 4 weeks to start on March 30th 2022.  Cabozantinib 40 mg started on June 10, 2020.   His dose was reduced to 20 mg in May 2022.  He is here for the next cycle of therapy.   Current therapy: Axitinib 5 mg twice a day started on September 01, 2021.  Interim History: Gordon Thomas returns today for a follow-up evaluation.  Since the last visit, he has started axitinib 5 mg twice a day but developed worsening erythema and pain in his lower extremities bilaterally.  After therapy interruption the symptoms has resolved at this time.  He did develop sinus congestion and pain associated with low-grade fever.  He denies any cough, wheezing or hemoptysis.  He denies any nausea or vomiting.     Medications: Updated on review.. Current Outpatient Medications  Medication Sig Dispense Refill   albuterol (VENTOLIN HFA) 108 (90 Base) MCG/ACT inhaler Inhale 1-2 puffs into the lungs every 6 (six) hours as needed for wheezing or shortness of breath.     axitinib (INLYTA) 5 MG tablet Take 1 tablet (5 mg total) by mouth 2 (two) times daily. 60 tablet 1   docusate sodium (COLACE) 100 MG capsule Take 1 capsule (100 mg total) by mouth 2 (two) times daily.     prochlorperazine (COMPAZINE) 10 MG tablet Take 1 tablet (10 mg total) by mouth every 6 (six) hours as needed for nausea or  vomiting. 30 tablet 0   promethazine (PHENERGAN) 12.5 MG tablet Take 1 tablet (12.5 mg total) by mouth every 4 (four) hours as needed for nausea or vomiting. 15 tablet 0   No current facility-administered medications for this visit.     Allergies: No Known Allergies    Physical Exam:    Blood pressure (!) 156/90, pulse (!) 103, temperature 100.1 F (37.8 C), temperature source Oral, resp. rate 18, height '5\' 5"'$  (1.651 m), weight 159 lb 5 oz (72.3 kg), SpO2 97 %.        ECOG:  0    General appearance: Alert, awake without any distress. Head: Atraumatic without abnormalities Oropharynx: Without any thrush or ulcers. Eyes: No scleral icterus. Lymph nodes: No lymphadenopathy noted in the cervical, supraclavicular, or axillary nodes Heart:regular rate and rhythm, without any murmurs or gallops.   Lung: Clear to auscultation without any rhonchi, wheezes or dullness to percussion. Abdomin: Soft, nontender without any shifting dullness or ascites. Musculoskeletal: No clubbing or cyanosis. Neurological: No motor or sensory deficits. Skin: No rashes or lesions.                    Lab Results: Lab Results  Component Value Date   WBC 7.3 08/29/2021   HGB 16.3 08/29/2021   HCT 45.0 08/29/2021   MCV 91.5 08/29/2021   PLT 278 08/29/2021     Chemistry      Component  Value Date/Time   NA 138 08/29/2021 0853   K 4.0 08/29/2021 0853   CL 106 08/29/2021 0853   CO2 27 08/29/2021 0853   BUN 11 08/29/2021 0853   CREATININE 1.19 08/29/2021 0853      Component Value Date/Time   CALCIUM 9.0 08/29/2021 0853   ALKPHOS 85 08/29/2021 0853   AST 38 08/29/2021 0853   ALT 44 08/29/2021 0853   BILITOT 0.4 08/29/2021 0853          Impression and Plan:   14 year old with:  1.  Kidney cancer diagnosed in April 2021 after presenting with stage IV clear-cell tumor and pulmonary involvement.  He is currently on axitinib which she has tolerated with few  complications of 10 mg daily.  Risks and benefits of resuming this treatment at 5 mg were discussed at this time.  Hypertension, hand-foot syndrome as well as GI toxicity were discussed.  He is agreeable to started at 5 mg daily dose.   2.  Goals of care and prognosis discussion: Aggressive measures are warranted given his young age and performance status.  Treatment is palliative however.  3.  Dermatological toxicity: Related to axitinib and should improve with dose reduction.   4.  Elevated liver function test: We will continue to monitor on axitinib therapy.  5.  Sinusitis: Prescription for Z-Pak I will be available to him.   6.  Follow-up: In 6 weeks for repeat follow-up.   30  minutes were spent on this visit.  The time was dedicated to reviewing laboratory data, disease status update and outlining future plan of care discussion.  Zola Button, MD 8/3/202312:37 PM

## 2021-10-06 ENCOUNTER — Other Ambulatory Visit (HOSPITAL_COMMUNITY): Payer: Self-pay

## 2021-10-09 ENCOUNTER — Telehealth: Payer: Self-pay | Admitting: Oncology

## 2021-10-09 NOTE — Telephone Encounter (Signed)
Scheduled per 08/03 los, patient has been called and notified of upcoming appointments. 

## 2021-10-17 ENCOUNTER — Other Ambulatory Visit (HOSPITAL_COMMUNITY): Payer: Self-pay

## 2021-10-19 ENCOUNTER — Other Ambulatory Visit (HOSPITAL_COMMUNITY): Payer: Self-pay

## 2021-11-01 ENCOUNTER — Other Ambulatory Visit (HOSPITAL_COMMUNITY): Payer: Self-pay

## 2021-11-08 ENCOUNTER — Other Ambulatory Visit (HOSPITAL_COMMUNITY): Payer: Self-pay

## 2021-11-21 ENCOUNTER — Other Ambulatory Visit (HOSPITAL_COMMUNITY): Payer: Self-pay

## 2021-11-22 ENCOUNTER — Inpatient Hospital Stay (HOSPITAL_BASED_OUTPATIENT_CLINIC_OR_DEPARTMENT_OTHER): Payer: BC Managed Care – PPO | Admitting: Oncology

## 2021-11-22 ENCOUNTER — Inpatient Hospital Stay: Payer: BC Managed Care – PPO | Attending: Oncology

## 2021-11-22 ENCOUNTER — Other Ambulatory Visit: Payer: Self-pay

## 2021-11-22 VITALS — BP 154/98 | HR 89 | Temp 97.7°F | Resp 17 | Ht 65.0 in | Wt 163.4 lb

## 2021-11-22 DIAGNOSIS — R7989 Other specified abnormal findings of blood chemistry: Secondary | ICD-10-CM | POA: Insufficient documentation

## 2021-11-22 DIAGNOSIS — C641 Malignant neoplasm of right kidney, except renal pelvis: Secondary | ICD-10-CM

## 2021-11-22 DIAGNOSIS — C7802 Secondary malignant neoplasm of left lung: Secondary | ICD-10-CM | POA: Diagnosis present

## 2021-11-22 DIAGNOSIS — C649 Malignant neoplasm of unspecified kidney, except renal pelvis: Secondary | ICD-10-CM | POA: Diagnosis present

## 2021-11-22 LAB — CMP (CANCER CENTER ONLY)
ALT: 32 U/L (ref 0–44)
AST: 31 U/L (ref 15–41)
Albumin: 4.3 g/dL (ref 3.5–5.0)
Alkaline Phosphatase: 89 U/L (ref 38–126)
Anion gap: 7 (ref 5–15)
BUN: 12 mg/dL (ref 6–20)
CO2: 29 mmol/L (ref 22–32)
Calcium: 8.9 mg/dL (ref 8.9–10.3)
Chloride: 104 mmol/L (ref 98–111)
Creatinine: 1.34 mg/dL — ABNORMAL HIGH (ref 0.61–1.24)
GFR, Estimated: >60 mL/min (ref >60)
Glucose, Bld: 99 mg/dL (ref 70–99)
Potassium: 3.7 mmol/L (ref 3.5–5.1)
Sodium: 140 mmol/L (ref 135–145)
Total Bilirubin: 0.5 mg/dL (ref 0.3–1.2)
Total Protein: 7.9 g/dL (ref 6.5–8.1)

## 2021-11-22 LAB — CBC WITH DIFFERENTIAL (CANCER CENTER ONLY)
Abs Immature Granulocytes: 0.01 10*3/uL (ref 0.00–0.07)
Basophils Absolute: 0.1 10*3/uL (ref 0.0–0.1)
Basophils Relative: 1 %
Eosinophils Absolute: 0.2 10*3/uL (ref 0.0–0.5)
Eosinophils Relative: 2 %
HCT: 47.1 % (ref 39.0–52.0)
Hemoglobin: 16.8 g/dL (ref 13.0–17.0)
Immature Granulocytes: 0 %
Lymphocytes Relative: 42 %
Lymphs Abs: 3.6 10*3/uL (ref 0.7–4.0)
MCH: 31.6 pg (ref 26.0–34.0)
MCHC: 35.7 g/dL (ref 30.0–36.0)
MCV: 88.7 fL (ref 80.0–100.0)
Monocytes Absolute: 0.8 10*3/uL (ref 0.1–1.0)
Monocytes Relative: 9 %
Neutro Abs: 4 10*3/uL (ref 1.7–7.7)
Neutrophils Relative %: 46 %
Platelet Count: 287 10*3/uL (ref 150–400)
RBC: 5.31 MIL/uL (ref 4.22–5.81)
RDW: 12.5 % (ref 11.5–15.5)
WBC Count: 8.7 10*3/uL (ref 4.0–10.5)
nRBC: 0 % (ref 0.0–0.2)

## 2021-11-22 NOTE — Progress Notes (Signed)
Hematology and Oncology Follow Up Visit  Gordon Thomas [id number] [birth year] 41 y.o. 11/22/2021 12:33 PM Gordon Thomas, MDShelton, [name], MD   Principle Diagnosis: 41 year old man with stage IV clear-cell with pulmonary involvement diagnosed in 2021.  He was found to have intermediate risk disease.   Prior Therapy: He is status post right radical nephrectomy completed on June 12, 2019.  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  Ipilimumab 1 mg/kg and nivolumab 3 mg/kg given every 3 weeks. Cycle 1 given on March 01, 2020. He completed 4 cycles of therapy on May 04, 2020.   Nivolumab 480 mg every 4 weeks to start on March 30th 2022.  Cabozantinib 40 mg started on June 10, 2020.   His dose was reduced to 20 mg in May 2022.  He is here for the next cycle of therapy.   Current therapy: Axitinib 5 mg twice a day started on September 01, 2021.  His dose was changed to 5 mg daily due to toxicities in August 2023.  Interim History: [name] returns today for a follow-up.  Since last visit, he reports feeling well without any major complaints.  He complication associated with axitinib predominantly he hand-foot syndrome as well as oral ulcers all have resolved at this time.  He is eating well and gaining his weight back.  His blisters on the lower extremity has resolved completely.  He is active and resumed work related duties without any decline.     Medications: Reviewed without changes. Current Outpatient Medications  Medication Sig Dispense Refill   albuterol (VENTOLIN HFA) 108 (90 Base) MCG/ACT inhaler Inhale 1-2 puffs into the lungs every 6 (six) hours as needed for wheezing or shortness of breath.     axitinib (INLYTA) 5 MG tablet Take 1 tablet (5 mg total) by mouth 2 (two) times daily. 60 tablet 1   azithromycin (ZITHROMAX Z-PAK) 250 MG tablet Take two tabs on day one. Take one tabs daily after that for 4 days. 6 each 0   docusate  sodium (COLACE) 100 MG capsule Take 1 capsule (100 mg total) by mouth 2 (two) times daily.     prochlorperazine (COMPAZINE) 10 MG tablet Take 1 tablet (10 mg total) by mouth every 6 (six) hours as needed for nausea or vomiting. 30 tablet 0   promethazine (PHENERGAN) 12.5 MG tablet Take 1 tablet (12.5 mg total) by mouth every 4 (four) hours as needed for nausea or vomiting. 15 tablet 0   No current facility-administered medications for this visit.     Allergies: No Known Allergies    Physical Exam:       Blood pressure (!) 154/98, pulse 89, temperature 97.7 F (36.5 C), temperature source Temporal, resp. rate 17, height '5\' 5"'$  (1.651 m), weight 163 lb 6.4 oz (74.1 kg), SpO2 99 %.      ECOG:  0     General appearance: Comfortable appearing without any discomfort Head: Normocephalic without any trauma Oropharynx: Mucous membranes are moist and pink without any thrush or ulcers. Eyes: Pupils are equal and round reactive to light. Lymph nodes: No cervical, supraclavicular, inguinal or axillary lymphadenopathy.   Heart:regular rate and rhythm.  S1 and S2 without leg edema. Lung: Clear without any rhonchi or wheezes.  No dullness to percussion. Abdomin: Soft, nontender, nondistended with good bowel sounds.  No hepatosplenomegaly. Musculoskeletal: No joint deformity or effusion.  Full range of motion noted. Neurological: No deficits noted on motor, sensory and  deep tendon reflex exam. Skin: No petechial rash or dryness.  Appeared moist.                     Lab Results: Lab Results  Component Value Date   WBC 8.1 10/05/2021   HGB 14.7 10/05/2021   HCT 42.1 10/05/2021   MCV 93.6 10/05/2021   PLT 293 10/05/2021     Chemistry      Component Value Date/Time   NA 136 10/05/2021 1235   K 3.8 10/05/2021 1235   CL 101 10/05/2021 1235   CO2 28 10/05/2021 1235   BUN 13 10/05/2021 1235   CREATININE 1.53 (H) 10/05/2021 1235      Component Value Date/Time    CALCIUM 8.7 (L) 10/05/2021 1235   ALKPHOS 80 10/05/2021 1235   AST 29 10/05/2021 1235   ALT 34 10/05/2021 1235   BILITOT 0.4 10/05/2021 1235          Impression and Plan:   41 year old with:  1. Stage IV clear-cell renal cell carcinoma with pulmonary involvement diagnosed in 2021.  Risks and benefits of continuing axitinib at 5 mg daily were discussed today.  He is agreeable to continue given his excellent tolerance at this time.  The plan is to update his staging scan before the next visit.  Different salvage therapy options including a different oral TKI could be considered.   2.  Goals of care and prognosis discussion: His disease is incurable although aggressive measures are warranted given his excellent performance status and age.  3.  Dermatological toxicity: Resolved after dose reduction of axitinib.  Hand-foot syndrome completely improved.   4.  Elevated liver function test: His LFTs are all within normal range.  On October 05, 2021.  These will be repeated today.   5.  Sinusitis: Resolved and is no longer taking any antibiotics.   6.  Follow-up: In next 4 to 6 weeks for repeat follow-up.   30  minutes were gated to this encounter.  The time was spent on updating his disease status, treatment choices and outlining future plan of care review.  Zola Button, MD 9/20/202312:33 PM

## 2021-12-05 ENCOUNTER — Other Ambulatory Visit: Payer: Self-pay | Admitting: Oncology

## 2021-12-05 ENCOUNTER — Other Ambulatory Visit (HOSPITAL_COMMUNITY): Payer: Self-pay

## 2021-12-05 MED ORDER — AXITINIB 5 MG PO TABS
5.0000 mg | ORAL_TABLET | Freq: Two times a day (BID) | ORAL | 1 refills | Status: DC
  Filled 2021-12-05: qty 60, 30d supply, fill #0
  Filled 2022-01-19: qty 60, 30d supply, fill #1

## 2021-12-07 ENCOUNTER — Other Ambulatory Visit (HOSPITAL_COMMUNITY): Payer: Self-pay

## 2021-12-13 ENCOUNTER — Telehealth: Payer: Self-pay | Admitting: Oncology

## 2021-12-13 NOTE — Telephone Encounter (Signed)
Rescheduled 10/27 appointment per provider pal, patient has been called and notified of new rescheduled appointment.

## 2021-12-22 ENCOUNTER — Ambulatory Visit (HOSPITAL_COMMUNITY)
Admission: RE | Admit: 2021-12-22 | Discharge: 2021-12-22 | Disposition: A | Discharge status: Home / Self Care | Payer: BC Managed Care – PPO | Source: Ambulatory Visit | Attending: Oncology | Admitting: Oncology

## 2021-12-22 ENCOUNTER — Other Ambulatory Visit: Payer: Self-pay

## 2021-12-22 ENCOUNTER — Inpatient Hospital Stay: Payer: BC Managed Care – PPO | Attending: Oncology

## 2021-12-22 DIAGNOSIS — C641 Malignant neoplasm of right kidney, except renal pelvis: Secondary | ICD-10-CM | POA: Insufficient documentation

## 2021-12-22 DIAGNOSIS — C7802 Secondary malignant neoplasm of left lung: Secondary | ICD-10-CM | POA: Insufficient documentation

## 2021-12-22 DIAGNOSIS — C649 Malignant neoplasm of unspecified kidney, except renal pelvis: Secondary | ICD-10-CM | POA: Diagnosis present

## 2021-12-22 LAB — CBC WITH DIFFERENTIAL (CANCER CENTER ONLY)
Abs Immature Granulocytes: 0.04 10*3/uL (ref 0.00–0.07)
Basophils Absolute: 0 10*3/uL (ref 0.0–0.1)
Basophils Relative: 0 %
Eosinophils Absolute: 0.2 10*3/uL (ref 0.0–0.5)
Eosinophils Relative: 2 %
HCT: 45.6 % (ref 39.0–52.0)
Hemoglobin: 16.2 g/dL (ref 13.0–17.0)
Immature Granulocytes: 0 %
Lymphocytes Relative: 36 %
Lymphs Abs: 3.3 10*3/uL (ref 0.7–4.0)
MCH: 30.8 pg (ref 26.0–34.0)
MCHC: 35.5 g/dL (ref 30.0–36.0)
MCV: 86.7 fL (ref 80.0–100.0)
Monocytes Absolute: 0.6 10*3/uL (ref 0.1–1.0)
Monocytes Relative: 7 %
Neutro Abs: 4.9 10*3/uL (ref 1.7–7.7)
Neutrophils Relative %: 55 %
Platelet Count: 280 10*3/uL (ref 150–400)
RBC: 5.26 MIL/uL (ref 4.22–5.81)
RDW: 12.9 % (ref 11.5–15.5)
WBC Count: 9.1 10*3/uL (ref 4.0–10.5)
nRBC: 0 % (ref 0.0–0.2)

## 2021-12-22 LAB — CMP (CANCER CENTER ONLY)
ALT: 41 U/L (ref 0–44)
AST: 32 U/L (ref 15–41)
Albumin: 4.1 g/dL (ref 3.5–5.0)
Alkaline Phosphatase: 72 U/L (ref 38–126)
Anion gap: 7 (ref 5–15)
BUN: 9 mg/dL (ref 6–20)
CO2: 28 mmol/L (ref 22–32)
Calcium: 9.4 mg/dL (ref 8.9–10.3)
Chloride: 104 mmol/L (ref 98–111)
Creatinine: 1.25 mg/dL — ABNORMAL HIGH (ref 0.61–1.24)
GFR, Estimated: >60 mL/min (ref >60)
Glucose, Bld: 185 mg/dL — ABNORMAL HIGH (ref 70–99)
Potassium: 4 mmol/L (ref 3.5–5.1)
Sodium: 139 mmol/L (ref 135–145)
Total Bilirubin: 0.6 mg/dL (ref 0.3–1.2)
Total Protein: 7.4 g/dL (ref 6.5–8.1)

## 2021-12-29 ENCOUNTER — Inpatient Hospital Stay: Payer: BC Managed Care – PPO | Admitting: Oncology

## 2022-01-04 ENCOUNTER — Other Ambulatory Visit: Payer: Self-pay

## 2022-01-04 ENCOUNTER — Inpatient Hospital Stay: Payer: BC Managed Care – PPO | Attending: Oncology | Admitting: Oncology

## 2022-01-04 VITALS — BP 159/99 | HR 98 | Temp 98.0°F | Resp 17 | Ht 65.0 in | Wt 163.7 lb

## 2022-01-04 DIAGNOSIS — Z79899 Other long term (current) drug therapy: Secondary | ICD-10-CM | POA: Diagnosis not present

## 2022-01-04 DIAGNOSIS — Z905 Acquired absence of kidney: Secondary | ICD-10-CM | POA: Insufficient documentation

## 2022-01-04 DIAGNOSIS — C641 Malignant neoplasm of right kidney, except renal pelvis: Secondary | ICD-10-CM | POA: Diagnosis not present

## 2022-01-04 DIAGNOSIS — R21 Rash and other nonspecific skin eruption: Secondary | ICD-10-CM | POA: Insufficient documentation

## 2022-01-04 DIAGNOSIS — R7989 Other specified abnormal findings of blood chemistry: Secondary | ICD-10-CM | POA: Diagnosis not present

## 2022-01-04 DIAGNOSIS — C7800 Secondary malignant neoplasm of unspecified lung: Secondary | ICD-10-CM | POA: Insufficient documentation

## 2022-01-04 DIAGNOSIS — C649 Malignant neoplasm of unspecified kidney, except renal pelvis: Secondary | ICD-10-CM | POA: Diagnosis not present

## 2022-01-04 NOTE — Progress Notes (Signed)
Hematology and Oncology Follow Up Visit  Gordon Thomas [id number] [birth year] 41 y.o. 01/04/2022 3:46 PM [name], MDShelton, [name], MD   Principle Diagnosis: 41 year old man with kidney cancer diagnosed in 2021.  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   Prior Therapy: He is status post right radical nephrectomy completed on June 12, 2019.  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  Ipilimumab 1 mg/kg and nivolumab 3 mg/kg given every 3 weeks. Cycle 1 given on March 01, 2020. He completed 4 cycles of therapy on May 04, 2020.   Nivolumab 480 mg every 4 weeks to start on March 30th 2022.  Cabozantinib 40 mg started on June 10, 2020.   His dose was reduced to 20 mg in May 2022.  He is here for the next cycle of therapy.   Current therapy: Axitinib 5 mg twice a day started on September 01, 2021.  His dose was changed to 5 mg daily due to toxicities in August 2023.  Interim History: [name] presents today for a follow-up evaluation.  Since the last visit, he reports no major changes in his health.  He does report some mild occasional eruptions on his back which he uses topical creams for.  He denies any chest pain or shortness of breath.  Denies any cough or wheezing.  He denies any diarrhea nausea or weight loss.     Medications: Updated on review. Current Outpatient Medications  Medication Sig Dispense Refill   albuterol (VENTOLIN HFA) 108 (90 Base) MCG/ACT inhaler Inhale 1-2 puffs into the lungs every 6 (six) hours as needed for wheezing or shortness of breath.     axitinib (INLYTA) 5 MG tablet Take 1 tablet (5 mg total) by mouth 2 (two) times daily. 60 tablet 1   azithromycin (ZITHROMAX Z-PAK) 250 MG tablet Take two tabs on day one. Take one tabs daily after that for 4 days. 6 each 0   docusate sodium (COLACE) 100 MG capsule Take 1 capsule (100 mg total) by mouth 2 (two) times daily.     prochlorperazine  (COMPAZINE) 10 MG tablet Take 1 tablet (10 mg total) by mouth every 6 (six) hours as needed for nausea or vomiting. 30 tablet 0   promethazine (PHENERGAN) 12.5 MG tablet Take 1 tablet (12.5 mg total) by mouth every 4 (four) hours as needed for nausea or vomiting. 15 tablet 0   No current facility-administered medications for this visit.     Allergies: No Known Allergies    Physical Exam:      Blood pressure (!) 159/99, pulse 98, temperature 98 F (36.7 C), temperature source Temporal, resp. rate 17, height '5\' 5"'$  (1.651 m), weight 163 lb 11.2 oz (74.3 kg), SpO2 100 %.        ECOG:  0    General appearance: Alert, awake without any distress. Head: Atraumatic without abnormalities Oropharynx: Without any thrush or ulcers. Eyes: No scleral icterus. Lymph nodes: No lymphadenopathy noted in the cervical, supraclavicular, or axillary nodes Heart:regular rate and rhythm, without any murmurs or gallops.   Lung: Clear to auscultation without any rhonchi, wheezes or dullness to percussion. Abdomin: Soft, nontender without any shifting dullness or ascites. Musculoskeletal: No clubbing or cyanosis. Neurological: No motor or sensory deficits. Skin: No rashes or lesions.                    Lab Results: Lab Results  Component Value Date   WBC  9.1 12/22/2021   HGB 16.2 12/22/2021   HCT 45.6 12/22/2021   MCV 86.7 12/22/2021   PLT 280 12/22/2021     Chemistry      Component Value Date/Time   NA 139 12/22/2021 1041   K 4.0 12/22/2021 1041   CL 104 12/22/2021 1041   CO2 28 12/22/2021 1041   BUN 9 12/22/2021 1041   CREATININE 1.25 (H) 12/22/2021 1041      Component Value Date/Time   CALCIUM 9.4 12/22/2021 1041   ALKPHOS 72 12/22/2021 1041   AST 32 12/22/2021 1041   ALT 41 12/22/2021 1041   BILITOT 0.6 12/22/2021 1041       Narrative & Impression  CLINICAL DATA:  Follow-up metastatic renal cell carcinoma. Previous right nephrectomy. * Tracking Code:  BO *   EXAM: CT CHEST, ABDOMEN AND PELVIS WITHOUT CONTRAST   TECHNIQUE: Multidetector CT imaging of the chest, abdomen and pelvis was performed following the standard protocol without IV contrast.   RADIATION DOSE REDUCTION: This exam was performed according to the departmental dose-optimization program which includes automated exposure control, adjustment of the mA and/or kV according to patient size and/or use of iterative reconstruction technique.   COMPARISON:  08/17/2021   FINDINGS: CT CHEST FINDINGS   Cardiovascular: No acute findings.   Mediastinum/Lymph Nodes: No masses or pathologically enlarged lymph nodes identified on this unenhanced exam.   Lungs/Pleura: Pulmonary nodule in the lateral right upper lobe currently measures 1.9 x 1.5 cm on image 59/4, compared to 2.7 x 1.6 cm previously. Another nodule in the anterior right lower lobe measures 8 mm on image 87/4, compared to 15 mm previously. Other scattered sub-cm pulmonary nodules remains stable. No new or enlarging pulmonary nodules or masses identified. No evidence of acute infiltrate or pleural effusion.   Musculoskeletal:  No suspicious bone lesions identified.   CT ABDOMEN AND PELVIS FINDINGS   Hepatobiliary: Hepatic steatosis again noted. Gallbladder is unremarkable. No evidence of biliary ductal dilatation. No masses visualized on this unenhanced exam.   Pancreas: No mass or inflammatory changes identified on this unenhanced exam.   Spleen:  Within normal limits in size.   Adrenals/Urinary Tract: Stable postop changes from right nephrectomy. Unremarkable appearance of left kidney on this unenhanced exam. No evidence of urolithiasis or hydronephrosis.   Stomach/Bowel: No evidence of obstruction, inflammatory process, or abnormal fluid collections. Normal appendix visualized.   Vascular/Lymphatic: No pathologically enlarged lymph nodes identified. No abdominal aortic aneurysm.   Reproductive:   No masses or other significant abnormality.   Other:  None.   Musculoskeletal:  No suspicious bone lesions identified.   IMPRESSION: Decreased pulmonary metastatic disease since previous study.   No new or progressive metastatic disease.   Hepatic steatosis.     Impression and Plan:   30 year old with:  1.  Kidney cancer diagnosed in 2021.  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  CT scan obtained on December 22, 2021 was personally reviewed and showed improvement in his pulmonary metastasis from previous studies without any new metastatic lesions.  Risks and benefits of continuing axitinib were discussed at this time.  He is agreeable to continue.   2.  Goals of care and prognosis discussion: Therapeutic is palliative though aggressive measures are warranted given his excellent performance status.  3.  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  He does have acneform rash on his back and using topical creams.  4.  Elevated liver function test: Normalized at this time based on labs in October 2023.   5.  Follow-up: In 2 months for a follow-up.   30  minutes were spent on this visit.  The time was dedicated to reviewing laboratory data, disease status update and outlining future plan of care discussion.  Zola Button, MD 11/2/20233:46 PM

## 2022-01-17 ENCOUNTER — Other Ambulatory Visit (HOSPITAL_COMMUNITY): Payer: Self-pay

## 2022-01-19 ENCOUNTER — Other Ambulatory Visit (HOSPITAL_COMMUNITY): Payer: Self-pay

## 2022-01-23 ENCOUNTER — Other Ambulatory Visit (HOSPITAL_COMMUNITY): Payer: Self-pay

## 2022-02-19 ENCOUNTER — Telehealth: Payer: Self-pay | Admitting: Oncology

## 2022-02-19 ENCOUNTER — Other Ambulatory Visit (HOSPITAL_COMMUNITY): Payer: Self-pay

## 2022-02-19 NOTE — Telephone Encounter (Signed)
Called patient regarding January appointments, patient is notified.  

## 2022-02-27 ENCOUNTER — Other Ambulatory Visit (HOSPITAL_COMMUNITY): Payer: Self-pay

## 2022-02-27 ENCOUNTER — Other Ambulatory Visit: Payer: Self-pay

## 2022-02-27 ENCOUNTER — Other Ambulatory Visit: Payer: Self-pay | Admitting: Oncology

## 2022-02-27 MED ORDER — AXITINIB 5 MG PO TABS
5.0000 mg | ORAL_TABLET | Freq: Two times a day (BID) | ORAL | 1 refills | Status: DC
  Filled 2022-02-27: qty 60, 30d supply, fill #0
  Filled 2022-04-09 – 2022-04-11 (×2): qty 60, 30d supply, fill #1

## 2022-03-01 ENCOUNTER — Other Ambulatory Visit (HOSPITAL_COMMUNITY): Payer: Self-pay

## 2022-03-07 ENCOUNTER — Inpatient Hospital Stay: Payer: BC Managed Care – PPO | Attending: Oncology

## 2022-03-07 ENCOUNTER — Inpatient Hospital Stay: Payer: BC Managed Care – PPO | Admitting: Oncology

## 2022-03-07 ENCOUNTER — Telehealth: Payer: Self-pay | Admitting: Oncology

## 2022-03-07 ENCOUNTER — Other Ambulatory Visit: Payer: Self-pay

## 2022-03-07 VITALS — BP 139/89 | HR 84 | Temp 97.9°F | Resp 17 | Ht 65.0 in | Wt 164.6 lb

## 2022-03-07 DIAGNOSIS — Z79899 Other long term (current) drug therapy: Secondary | ICD-10-CM | POA: Diagnosis not present

## 2022-03-07 DIAGNOSIS — C649 Malignant neoplasm of unspecified kidney, except renal pelvis: Secondary | ICD-10-CM | POA: Diagnosis not present

## 2022-03-07 DIAGNOSIS — C641 Malignant neoplasm of right kidney, except renal pelvis: Secondary | ICD-10-CM

## 2022-03-07 DIAGNOSIS — Z905 Acquired absence of kidney: Secondary | ICD-10-CM | POA: Insufficient documentation

## 2022-03-07 DIAGNOSIS — C7800 Secondary malignant neoplasm of unspecified lung: Secondary | ICD-10-CM | POA: Insufficient documentation

## 2022-03-07 LAB — CBC WITH DIFFERENTIAL (CANCER CENTER ONLY)
Abs Immature Granulocytes: 0.02 10*3/uL (ref 0.00–0.07)
Basophils Absolute: 0.1 10*3/uL (ref 0.0–0.1)
Basophils Relative: 1 %
Eosinophils Absolute: 0.3 10*3/uL (ref 0.0–0.5)
Eosinophils Relative: 3 %
HCT: 49.6 % (ref 39.0–52.0)
Hemoglobin: 17.4 g/dL — ABNORMAL HIGH (ref 13.0–17.0)
Immature Granulocytes: 0 %
Lymphocytes Relative: 41 %
Lymphs Abs: 3.8 10*3/uL (ref 0.7–4.0)
MCH: 31 pg (ref 26.0–34.0)
MCHC: 35.1 g/dL (ref 30.0–36.0)
MCV: 88.3 fL (ref 80.0–100.0)
Monocytes Absolute: 0.8 10*3/uL (ref 0.1–1.0)
Monocytes Relative: 8 %
Neutro Abs: 4.3 10*3/uL (ref 1.7–7.7)
Neutrophils Relative %: 47 %
Platelet Count: 297 10*3/uL (ref 150–400)
RBC: 5.62 MIL/uL (ref 4.22–5.81)
RDW: 13.2 % (ref 11.5–15.5)
WBC Count: 9.3 10*3/uL (ref 4.0–10.5)
nRBC: 0 % (ref 0.0–0.2)

## 2022-03-07 LAB — CMP (CANCER CENTER ONLY)
ALT: 53 U/L — ABNORMAL HIGH (ref 0–44)
AST: 43 U/L — ABNORMAL HIGH (ref 15–41)
Albumin: 4 g/dL (ref 3.5–5.0)
Alkaline Phosphatase: 87 U/L (ref 38–126)
Anion gap: 8 (ref 5–15)
BUN: 11 mg/dL (ref 6–20)
CO2: 26 mmol/L (ref 22–32)
Calcium: 9.3 mg/dL (ref 8.9–10.3)
Chloride: 106 mmol/L (ref 98–111)
Creatinine: 1.19 mg/dL (ref 0.61–1.24)
GFR, Estimated: >60 mL/min (ref >60)
Glucose, Bld: 95 mg/dL (ref 70–99)
Potassium: 4.2 mmol/L (ref 3.5–5.1)
Sodium: 140 mmol/L (ref 135–145)
Total Bilirubin: 0.6 mg/dL (ref 0.3–1.2)
Total Protein: 7.7 g/dL (ref 6.5–8.1)

## 2022-03-07 NOTE — Progress Notes (Signed)
Hematology and Oncology Follow Up Visit  Gordon Thomas [id number] 1980/03/08 42 y.o. 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  Prior Therapy: He is status post right radical nephrectomy completed on June 12, 2019.  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  Ipilimumab 1 mg/kg and nivolumab 3 mg/kg given every 3 weeks. Cycle 1 given on March 01, 2020. He completed 4 cycles of therapy on May 04, 2020.   Nivolumab 480 mg every 4 weeks to start on March 30th 2022.  Cabozantinib 40 mg started on June 10, 2020.   His dose was reduced to 20 mg in May 2022.  He is here for the next cycle of therapy.   Current therapy: Axitinib 5 mg twice a day started on September 01, 2021.  His dose was changed to 5 mg daily due to dermatological and gastrointestinal complications in August 8527.  Interim History: [name] returns today for a follow-up visit.  Since the last visit, he reports feeling well without any major complaints.  He denies any nausea, vomiting or abdominal pain.  He denies any hospitalizations or illnesses.  He denies any skin rashes or lesions.  He denies excessive fatigue or tiredness.     Medications: Reviewed without changes. Current Outpatient Medications  Medication Sig Dispense Refill   albuterol (VENTOLIN HFA) 108 (90 Base) MCG/ACT inhaler Inhale 1-2 puffs into the lungs every 6 (six) hours as needed for wheezing or shortness of breath.     axitinib (INLYTA) 5 MG tablet Take 1 tablet (5 mg total) by mouth 2 (two) times daily. 60 tablet 1   azithromycin (ZITHROMAX Z-PAK) 250 MG tablet Take two tabs on day one. Take one tabs daily after that for 4 days. 6 each 0   docusate sodium (COLACE) 100 MG capsule Take 1 capsule (100 mg total) by mouth 2 (two) times daily.     prochlorperazine  (COMPAZINE) 10 MG tablet Take 1 tablet (10 mg total) by mouth every 6 (six) hours as needed for nausea or vomiting. 30 tablet 0   promethazine (PHENERGAN) 12.5 MG tablet Take 1 tablet (12.5 mg total) by mouth every 4 (four) hours as needed for nausea or vomiting. 15 tablet 0   No current facility-administered medications for this visit.     Allergies: No Known Allergies    Physical Exam:        Blood pressure 139/89, pulse 84, temperature 97.9 F (36.6 C), temperature source Temporal, resp. rate 17, height '5\' 5"'$  (1.651 m), weight 164 lb 9.6 oz (74.7 kg), SpO2 99 %.       ECOG:  0     General appearance: Comfortable appearing without any discomfort Head: Normocephalic without any trauma Oropharynx: Mucous membranes are moist and pink without any thrush or ulcers. Eyes: Pupils are equal and round reactive to light. Lymph nodes: No cervical, supraclavicular, inguinal or axillary lymphadenopathy.   Heart:regular rate and rhythm.  S1 and S2 without leg edema. Lung: Clear without any rhonchi or wheezes.  No dullness to percussion. Abdomin: Soft, nontender, nondistended with good bowel sounds.  No hepatosplenomegaly. Musculoskeletal: No joint deformity or effusion.  Full range of motion noted. Neurological: No deficits noted on motor, sensory and deep tendon reflex exam. Skin: No petechial rash or dryness.  Appeared moist.  Lab Results: Lab Results  Component Value Date   WBC 9.1 12/22/2021   HGB 16.2 12/22/2021   HCT 45.6 12/22/2021   MCV 86.7 12/22/2021   PLT 280 12/22/2021     Chemistry      Component Value Date/Time   NA 139 12/22/2021 1041   K 4.0 12/22/2021 1041   CL 104 12/22/2021 1041   CO2 28 12/22/2021 1041   BUN 9 12/22/2021 1041   CREATININE 1.25 (H) 12/22/2021 1041      Component Value Date/Time   CALCIUM 9.4 12/22/2021 1041   ALKPHOS 72 12/22/2021 1041   AST 32 12/22/2021 1041   ALT 41 12/22/2021 1041    BILITOT 0.6 12/22/2021 1041          Impression and Plan:   42 year old with:  1. Stage IV clear-cell renal cell carcinoma with lung involvement diagnosed in 2021.  He continues to be on axitinib without any major complications.  Imaging studies obtained in October 2023 showed positive response to therapy.  Risks and benefits of continuing this treatment were discussed.  Complications include diarrhea, weight loss, hand-foot syndrome and hypertension reviewed.  Alternative treatment options including other oral targeted therapy such as Margot Chimes or belzutifan would be used as salvage.  At this time, he is agreeable to continue the same dose and schedule and we will update his staging scan before the next visit.   2.  Goals of care and prognosis discussion: Any treatment is palliative at this time although aggressive measures are warranted given his age and pulm status.  3.  Dermatological toxicity: Remains very limited at the lower dose of axitinib at this time.   4.  Elevated liver function test: Related to axitinib and normalized at this time.   5.  Follow-up: He will return in 2 months to update his staging scans.   30  minutes were spent on this visit.  The time was dedicated to reviewing laboratory data, disease status update and outlining future plan of care discussion.  Zola Button, MD 1/3/20248:04 AM

## 2022-03-07 NOTE — Telephone Encounter (Signed)
Scheduled per 01/03 los, patient checked out in person and will be notified of upcoming appointment per Mychart.

## 2022-03-20 ENCOUNTER — Other Ambulatory Visit (HOSPITAL_COMMUNITY): Payer: Self-pay

## 2022-04-05 ENCOUNTER — Other Ambulatory Visit (HOSPITAL_COMMUNITY): Payer: Self-pay

## 2022-04-09 ENCOUNTER — Other Ambulatory Visit (HOSPITAL_COMMUNITY): Payer: Self-pay

## 2022-04-10 ENCOUNTER — Other Ambulatory Visit: Payer: Self-pay

## 2022-04-11 ENCOUNTER — Other Ambulatory Visit: Payer: Self-pay

## 2022-04-11 ENCOUNTER — Other Ambulatory Visit (HOSPITAL_COMMUNITY): Payer: Self-pay

## 2022-04-11 ENCOUNTER — Telehealth: Payer: Self-pay | Admitting: Pharmacy Technician

## 2022-04-11 NOTE — Telephone Encounter (Signed)
Oral Oncology Patient Advocate Encounter   Was successful in obtaining a copay card for Inlyta.  This copay card will make the patients copay $0.  I have spoken with the patient.    The billing information is as follows and has been shared with [facility].   RxBin: [id number] Member ID: [id number] Group ID: [id number]   Lady Deutscher, CPhT-Adv Oncology [facility] [place] Direct Number: [phone]  Fax: [phone]

## 2022-04-18 IMAGING — CT CT ABD-PELV W/ CM
3 series · 14 of 32 positions shown, 18 images · IV contrast (APPLIED)
Comparison: 10/26/2019

CLINICAL DATA: Follow-up renal cell carcinoma, history of right
nephrectomy, follow-up lung nodule

EXAM:
CT CHEST, ABDOMEN, AND PELVIS WITH CONTRAST
TECHNIQUE: Multidetector CT imaging of the chest, abdomen and pelvis was
performed following the standard protocol during bolus
administration of intravenous contrast.
CONTRAST:  100mL 4L1P3G-CZZ IOPAMIDOL (4L1P3G-CZZ) INJECTION 61%,
additional oral enteric contrast

[Series 3: chest/abd/pelvis w/cm · axial · 0.79mm/px · z∈[-632,-77]mm · 8 of 135 slices shown]
[im 12/135  soft-tissue]
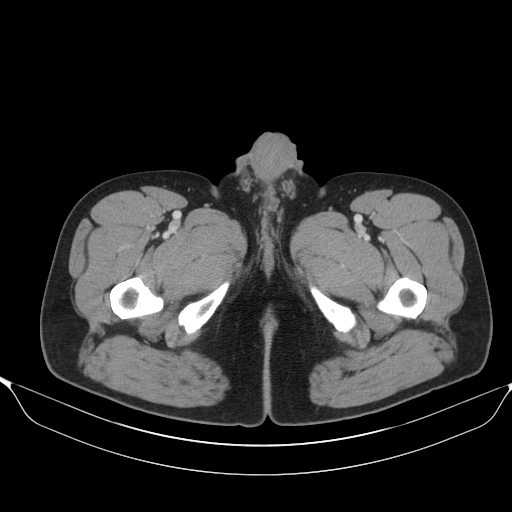
[im 34/135  soft-tissue]
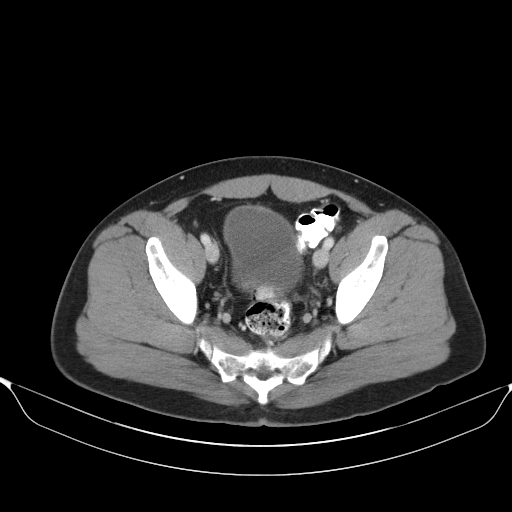
[im 45/135  soft-tissue]
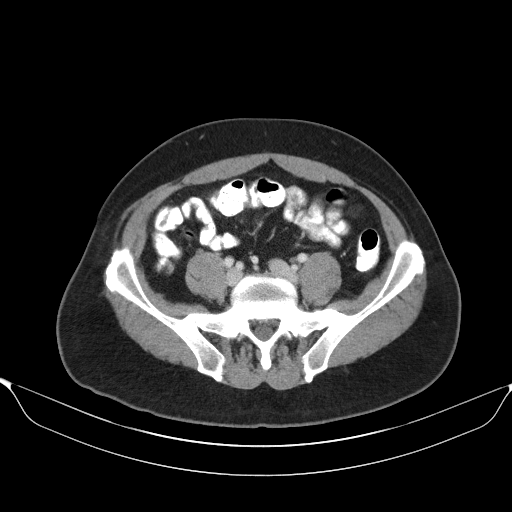
[im 56/135  soft-tissue]
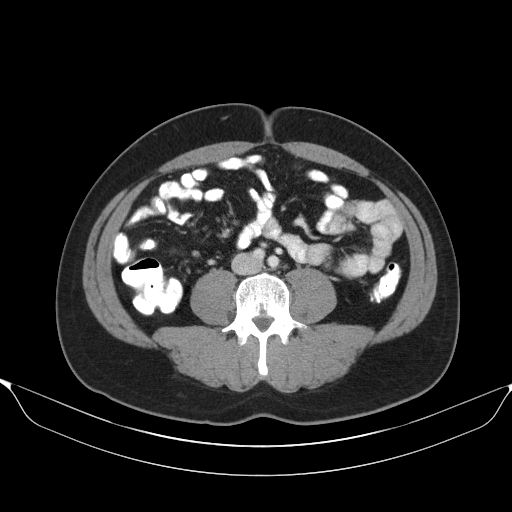
[im 79/135  soft-tissue]
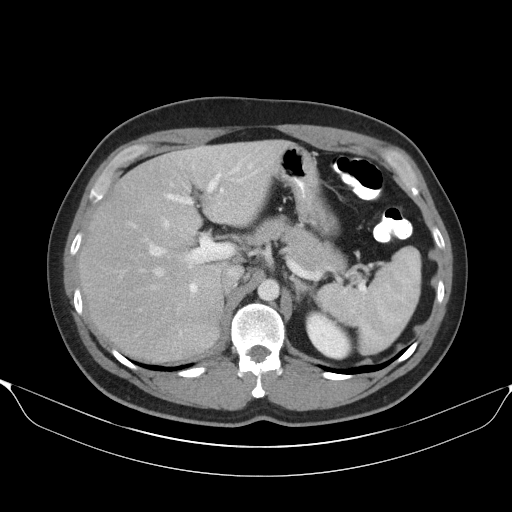
[im 90/135  soft-tissue]
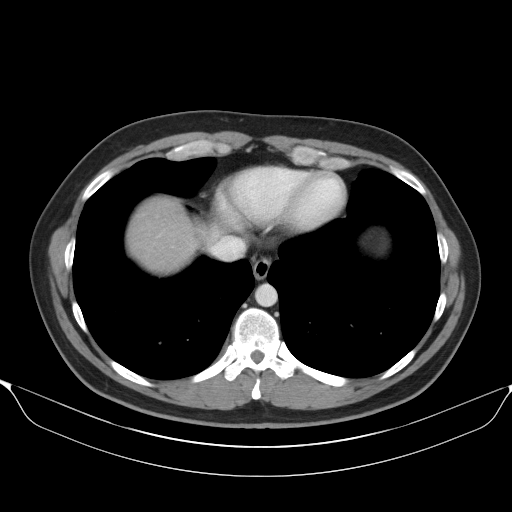
[im 101/135  soft-tissue]
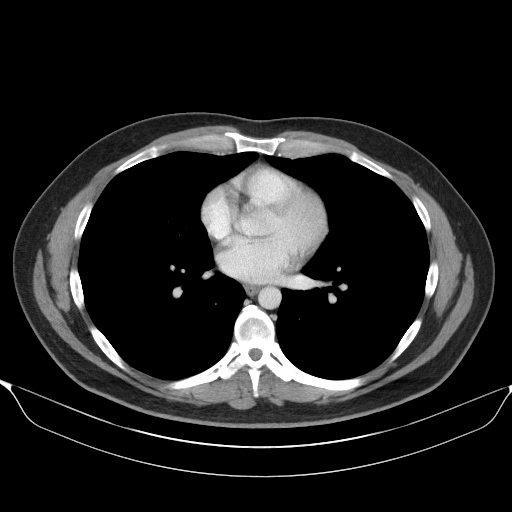
[im 123/135  soft-tissue]
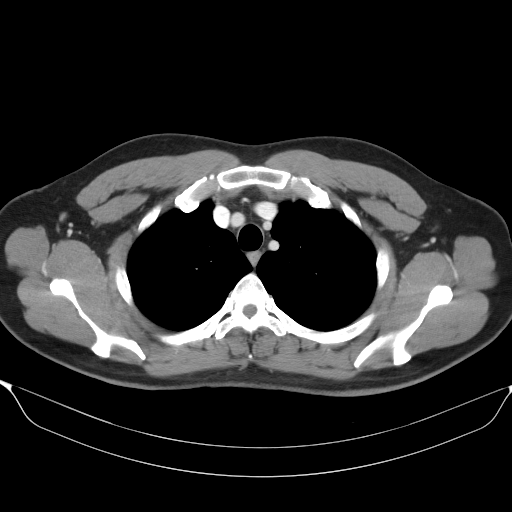

[Series 7: lung · axial · 0.62mm/px · z∈[-303,-221]mm · 3 of 154 slices shown]
[im 11/154  bone]
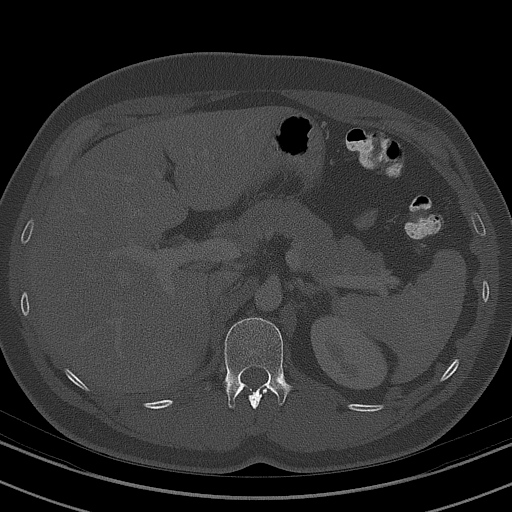
[im 31/154  bone]
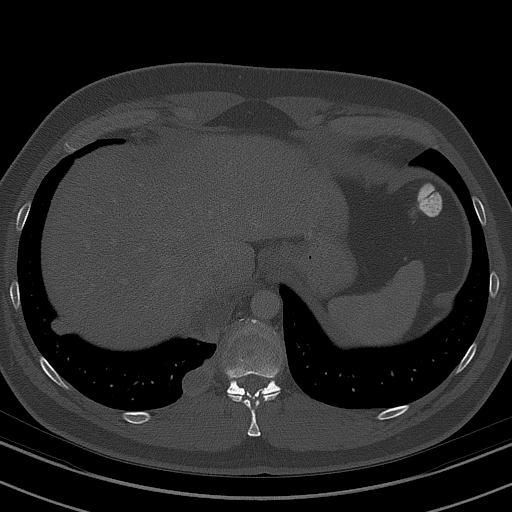
[im 52/154  bone]
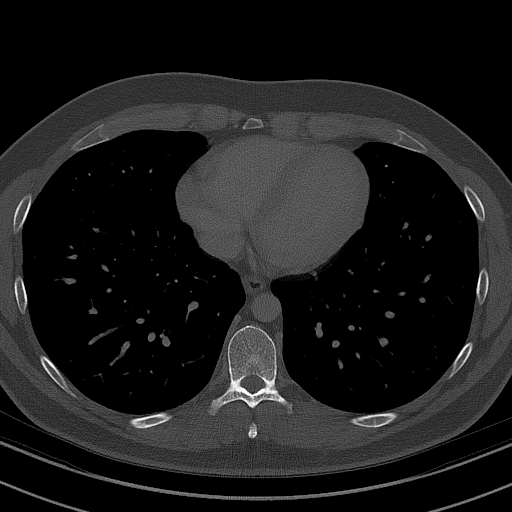

[Series 8: renal delay · axial · delayed · 0.79mm/px · z∈[-405,-280]mm · 3 of 26 slices shown, 7 images]
[im 1/26  soft-tissue]
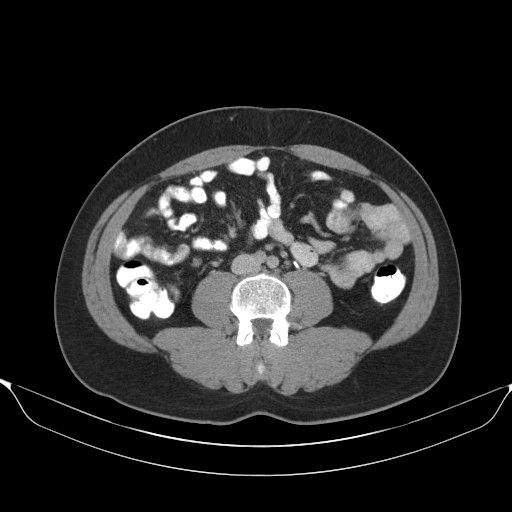
[im 1/26  lung]
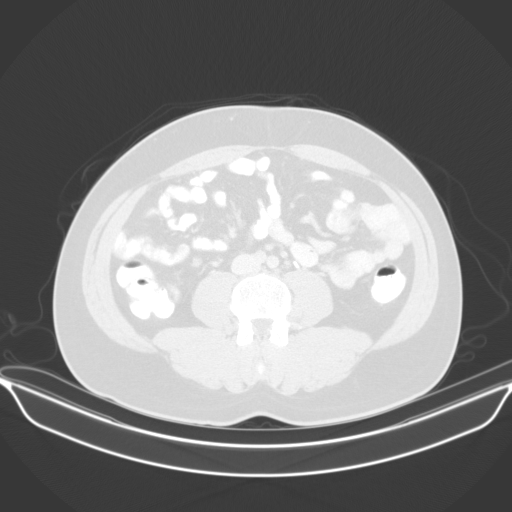
[im 1/26  bone]
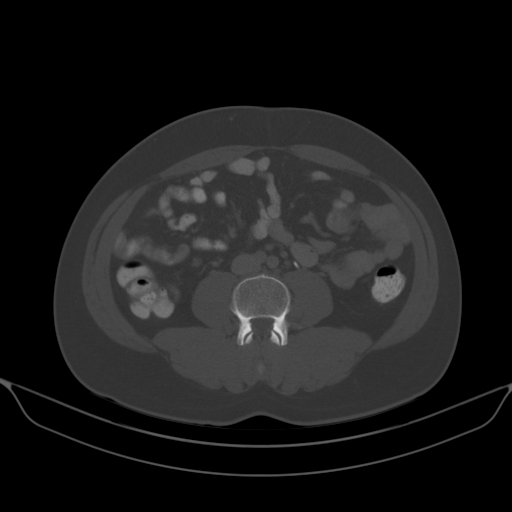
[im 13/26  soft-tissue]
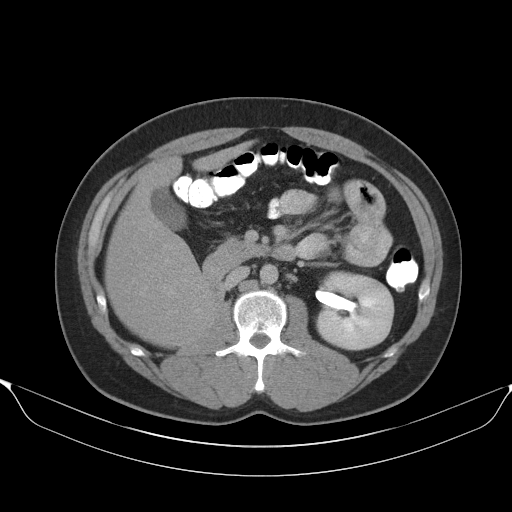
[im 13/26  lung]
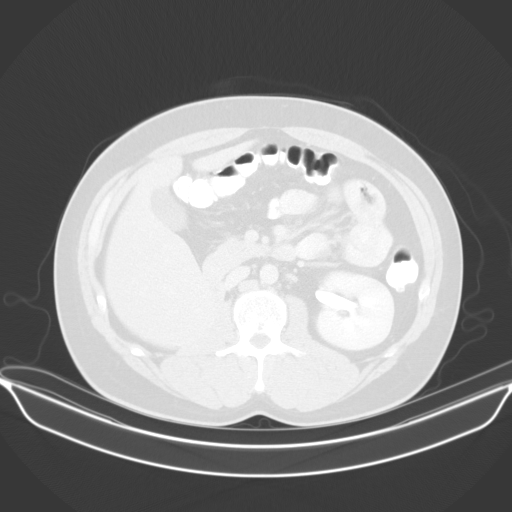
[im 26/26  soft-tissue]
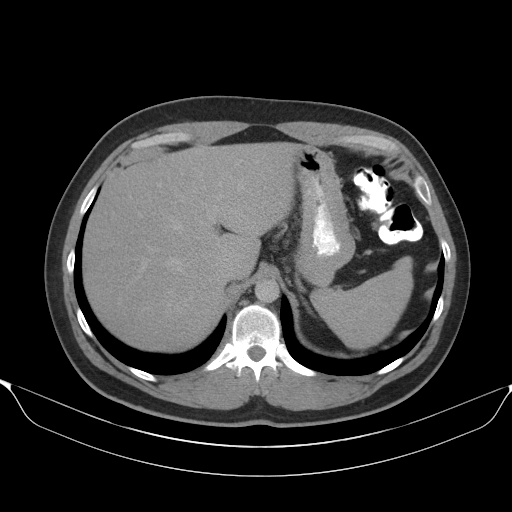
[im 26/26  lung]
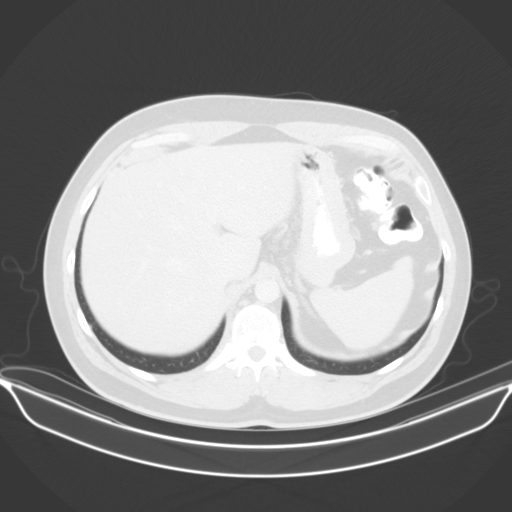

[14 of 32 positions shown; findings below may reference images not displayed]

FINDINGS: CT CHEST FINDINGS

Cardiovascular: No significant vascular findings. Normal heart size.
No pericardial effusion.

Mediastinum/Nodes: No enlarged mediastinal, hilar, or axillary lymph
nodes. Thymic remnant in the anterior mediastinum. Thyroid gland,
trachea, and esophagus demonstrate no significant findings.

Lungs/Pleura: There are multiple new and enlarged pulmonary nodules
throughout the lungs, for example an enlarged nodule of the right
apex measuring 0.8 cm, previously 0.5 cm (series 7, image 25) and a
new 3 mm subpleural nodule of the left lower lobe (series 7, image
80). Multiple new heterogeneously enhancing pleural nodules about
the right lung base, the largest measuring 3.1 x 3.0 cm (series 3,
image 50). No pleural effusion or pneumothorax.

Musculoskeletal: No chest wall mass or suspicious bone lesions
identified.

CT ABDOMEN PELVIS FINDINGS

Hepatobiliary: No solid liver abnormality is seen. No gallstones,
gallbladder wall thickening, or biliary dilatation.

Pancreas: Unremarkable. No pancreatic ductal dilatation or
surrounding inflammatory changes.

Spleen: Normal in size without significant abnormality.

Adrenals/Urinary Tract: Adrenal glands are unremarkable. Status post
right nephrectomy. Bladder is unremarkable.

Stomach/Bowel: Stomach is within normal limits. Appendix appears
normal. No evidence of bowel wall thickening, distention, or
inflammatory changes.

Vascular/Lymphatic: No significant vascular findings are present. No
enlarged abdominal or pelvic lymph nodes.

Reproductive: No mass or other abnormality.

Other: No abdominal wall hernia or abnormality. No abdominopelvic
ascites. There is a heterogeneously enhancing soft tissue nodule in
the vicinity of the right adrenal gland, which is now discrete and
measurable, measuring 1.8 x 1.7 cm (series 3, image 61).

Musculoskeletal: No acute or significant osseous findings.
IMPRESSION: 1. Multiple new and enlarged pulmonary nodules throughout the lungs.
2. Multiple new heterogeneously enhancing pleural nodules about the
right lung base.
3. There is a heterogeneously enhancing soft tissue nodule in the
vicinity of the right adrenal gland, which enlarged, now discrete
and measurable.
4. Findings are consistent with locally recurrent and metastatic
renal cell carcinoma following right nephrectomy.

These results will be called to the ordering clinician or
representative by the Radiologist Assistant, and communication
documented in the PACS or [REDACTED].

## 2022-04-24 ENCOUNTER — Ambulatory Visit (HOSPITAL_COMMUNITY): Payer: BC Managed Care – PPO

## 2022-04-24 ENCOUNTER — Other Ambulatory Visit: Payer: Self-pay

## 2022-04-24 DIAGNOSIS — C641 Malignant neoplasm of right kidney, except renal pelvis: Secondary | ICD-10-CM

## 2022-05-02 ENCOUNTER — Ambulatory Visit (HOSPITAL_COMMUNITY): Admission: RE | Admit: 2022-05-02 | Payer: BC Managed Care – PPO | Source: Ambulatory Visit

## 2022-05-02 ENCOUNTER — Encounter (HOSPITAL_COMMUNITY): Payer: Self-pay

## 2022-05-04 ENCOUNTER — Telehealth: Payer: Self-pay | Admitting: Internal Medicine

## 2022-05-04 NOTE — Telephone Encounter (Signed)
Called patient per 3/1 IB message. Patient scheduled and notified.

## 2022-05-08 ENCOUNTER — Other Ambulatory Visit: Payer: Self-pay | Admitting: Oncology

## 2022-05-08 ENCOUNTER — Other Ambulatory Visit (HOSPITAL_COMMUNITY): Payer: Self-pay

## 2022-05-08 MED ORDER — AXITINIB 5 MG PO TABS
5.0000 mg | ORAL_TABLET | Freq: Two times a day (BID) | ORAL | 1 refills | Status: DC
  Filled 2022-05-08: qty 60, 30d supply, fill #0
  Filled 2022-06-28: qty 60, 30d supply, fill #1

## 2022-05-09 ENCOUNTER — Inpatient Hospital Stay: Payer: BC Managed Care – PPO | Admitting: Internal Medicine

## 2022-05-09 ENCOUNTER — Other Ambulatory Visit (HOSPITAL_COMMUNITY): Payer: Self-pay

## 2022-05-09 ENCOUNTER — Inpatient Hospital Stay: Payer: BC Managed Care – PPO

## 2022-05-18 ENCOUNTER — Ambulatory Visit (HOSPITAL_COMMUNITY)
Admission: RE | Admit: 2022-05-18 | Discharge: 2022-05-18 | Disposition: A | Discharge status: Home / Self Care | Payer: BC Managed Care – PPO | Source: Ambulatory Visit | Attending: Internal Medicine | Admitting: Internal Medicine

## 2022-05-18 ENCOUNTER — Inpatient Hospital Stay: Payer: BC Managed Care – PPO | Attending: Internal Medicine

## 2022-05-18 DIAGNOSIS — C649 Malignant neoplasm of unspecified kidney, except renal pelvis: Secondary | ICD-10-CM | POA: Insufficient documentation

## 2022-05-18 DIAGNOSIS — Z79899 Other long term (current) drug therapy: Secondary | ICD-10-CM | POA: Insufficient documentation

## 2022-05-18 DIAGNOSIS — C641 Malignant neoplasm of right kidney, except renal pelvis: Secondary | ICD-10-CM | POA: Diagnosis present

## 2022-05-18 DIAGNOSIS — Z905 Acquired absence of kidney: Secondary | ICD-10-CM | POA: Insufficient documentation

## 2022-05-18 LAB — CBC WITH DIFFERENTIAL/PLATELET
Abs Immature Granulocytes: 0.04 10*3/uL (ref 0.00–0.07)
Basophils Absolute: 0.1 10*3/uL (ref 0.0–0.1)
Basophils Relative: 1 %
Eosinophils Absolute: 0.3 10*3/uL (ref 0.0–0.5)
Eosinophils Relative: 3 %
HCT: 47.5 % (ref 39.0–52.0)
Hemoglobin: 16.5 g/dL (ref 13.0–17.0)
Immature Granulocytes: 1 %
Lymphocytes Relative: 46 %
Lymphs Abs: 4.1 10*3/uL — ABNORMAL HIGH (ref 0.7–4.0)
MCH: 30.2 pg (ref 26.0–34.0)
MCHC: 34.7 g/dL (ref 30.0–36.0)
MCV: 86.8 fL (ref 80.0–100.0)
Monocytes Absolute: 0.6 10*3/uL (ref 0.1–1.0)
Monocytes Relative: 8 %
Neutro Abs: 3.5 10*3/uL (ref 1.7–7.7)
Neutrophils Relative %: 41 %
Platelets: 303 10*3/uL (ref 150–400)
RBC: 5.47 MIL/uL (ref 4.22–5.81)
RDW: 12.8 % (ref 11.5–15.5)
WBC: 8.6 10*3/uL (ref 4.0–10.5)
nRBC: 0 % (ref 0.0–0.2)

## 2022-05-18 LAB — COMPREHENSIVE METABOLIC PANEL
ALT: 58 U/L — ABNORMAL HIGH (ref 0–44)
AST: 47 U/L — ABNORMAL HIGH (ref 15–41)
Albumin: 4.6 g/dL (ref 3.5–5.0)
Alkaline Phosphatase: 95 U/L (ref 38–126)
Anion gap: 8 (ref 5–15)
BUN: 9 mg/dL (ref 6–20)
CO2: 29 mmol/L (ref 22–32)
Calcium: 9.6 mg/dL (ref 8.9–10.3)
Chloride: 102 mmol/L (ref 98–111)
Creatinine, Ser: 1.19 mg/dL (ref 0.61–1.24)
GFR, Estimated: >60 mL/min (ref >60)
Glucose, Bld: 96 mg/dL (ref 70–99)
Potassium: 4 mmol/L (ref 3.5–5.1)
Sodium: 139 mmol/L (ref 135–145)
Total Bilirubin: 0.4 mg/dL (ref 0.3–1.2)
Total Protein: 8.8 g/dL — ABNORMAL HIGH (ref 6.5–8.1)

## 2022-05-18 MED ORDER — SODIUM CHLORIDE (PF) 0.9 % IJ SOLN
INTRAMUSCULAR | Status: AC
  Filled 2022-05-18: qty 50

## 2022-05-18 MED ORDER — IOHEXOL 300 MG/ML  SOLN
80.0000 mL | Freq: Once | INTRAMUSCULAR | Status: AC | PRN
  Administered 2022-05-18: 80 mL via INTRAVENOUS

## 2022-05-22 ENCOUNTER — Inpatient Hospital Stay (HOSPITAL_BASED_OUTPATIENT_CLINIC_OR_DEPARTMENT_OTHER): Payer: BC Managed Care – PPO | Admitting: Internal Medicine

## 2022-05-22 VITALS — BP 149/110 | HR 96 | Temp 98.7°F | Resp 17 | Wt 168.2 lb

## 2022-05-22 DIAGNOSIS — Z79899 Other long term (current) drug therapy: Secondary | ICD-10-CM | POA: Diagnosis not present

## 2022-05-22 DIAGNOSIS — C641 Malignant neoplasm of right kidney, except renal pelvis: Secondary | ICD-10-CM

## 2022-05-22 DIAGNOSIS — C7800 Secondary malignant neoplasm of unspecified lung: Secondary | ICD-10-CM | POA: Diagnosis present

## 2022-05-22 DIAGNOSIS — Z905 Acquired absence of kidney: Secondary | ICD-10-CM | POA: Diagnosis not present

## 2022-05-22 DIAGNOSIS — C649 Malignant neoplasm of unspecified kidney, except renal pelvis: Secondary | ICD-10-CM | POA: Diagnosis not present

## 2022-05-22 NOTE — Progress Notes (Signed)
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 2) Status post treatment with immunotherapy with ipilimumab 1 Mg/KG and nivolumab 3 mg/KG every 3 weeks started March 01, 2020 for 4 cycles completed on May 04, 2020. 3) Status post maintenance treatment with nivolumab 480 Mg IV every 4 weeks in addition to Cabometyx 40 Mg p.o. daily on June 01, 2020 and his dose of Cabometyx was reduced to 20 mg p.o. daily in May 2022.   CURRENT THERAPY: Axitinib 5 mg p.o. twice daily started September 01, 2021 reduced to 5 mg p.o. daily in August 2023 secondary to toxicity.  INTERVAL HISTORY: Gordon Thomas 42 y.o. male came to the clinic today to establish care with me after his primary oncologist Dr. Alen Blew left the practice.  The patient is feeling fine today with no concerning complaints.  He denied having any current chest pain, shortness of breath, cough or hemoptysis.  He has no nausea, vomiting, diarrhea or constipation.  He has no headache or visual changes.  He denied having any significant weight loss or night sweats.  He is here today for evaluation with repeat CT scan of the chest, abdomen and pelvis for restaging of his disease. The patient has no other medical condition.  His family history is significant for mother with kidney cancer and father had diabetes. He is single with no children.  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  He has no history for smoking but drinks alcohol occasionally and no history of drug abuse.  MEDICAL HISTORY: Past Medical History:  Diagnosis Date    Anxiety    Depression     ALLERGIES:  has No Known Allergies.  MEDICATIONS:  Current Outpatient Medications  Medication Sig Dispense Refill   albuterol (VENTOLIN HFA) 108 (90 Base) MCG/ACT inhaler Inhale 1-2 puffs into the lungs every 6 (six) hours as needed for wheezing or shortness of breath.     axitinib (INLYTA) 5 MG tablet Take 1 tablet (5 mg total) by mouth 2 (two) times daily. 60 tablet 1   docusate sodium (COLACE) 100 MG capsule Take 1 capsule (100 mg total) by mouth 2 (two) times daily. (Patient not taking: Reported on 05/22/2022)     prochlorperazine (COMPAZINE) 10 MG tablet Take 1 tablet (10 mg total) by mouth every 6 (six) hours as needed for nausea or vomiting. (Patient not taking: Reported on 05/22/2022) 30 tablet 0   promethazine (PHENERGAN) 12.5 MG tablet Take 1 tablet (12.5 mg total) by mouth every 4 (four) hours as needed for nausea or vomiting. (Patient not taking: Reported on 05/22/2022) 15 tablet 0   No current facility-administered medications for this visit.    SURGICAL HISTORY:  Past Surgical History:  Procedure Laterality Date   NO PAST SURGERIES     ROBOT ASSISTED LAPAROSCOPIC NEPHRECTOMY Right 06/12/2019   Procedure: XI ROBOTIC ASSISTED LAPAROSCOPIC NEPHRECTOMY;  Surgeon: Ceasar Mons, MD;  Location: [facility];  Service: Urology;  Laterality: Right;    REVIEW OF SYSTEMS:  Constitutional: negative Eyes: negative Ears, nose, mouth, throat, and face: negative Respiratory: negative Cardiovascular: negative Gastrointestinal: negative Genitourinary:negative  Integument/breast: negative Hematologic/lymphatic: negative Musculoskeletal:negative Neurological: negative Behavioral/Psych: negative Endocrine: negative Allergic/Immunologic: negative   PHYSICAL EXAMINATION: General appearance: alert, cooperative, and no distress Head: Normocephalic, without obvious abnormality, atraumatic Neck: no adenopathy, no JVD, supple, symmetrical, trachea midline, and  thyroid not enlarged, symmetric, no tenderness/mass/nodules Lymph nodes: Cervical, supraclavicular, and axillary nodes normal. Resp: clear to auscultation bilaterally Back: symmetric, no curvature. ROM normal. No CVA tenderness. Cardio: regular rate and rhythm, S1, S2 normal, no murmur, click, rub or gallop GI: soft, non-tender; bowel sounds normal; no masses,  no organomegaly Extremities: extremities normal, atraumatic, no cyanosis or edema Neurologic: Alert and oriented X 3, normal strength and tone. Normal symmetric reflexes. Normal coordination and gait  ECOG PERFORMANCE STATUS: 1 - Symptomatic but completely ambulatory  Blood pressure (!) 149/110, pulse 96, temperature 98.7 F (37.1 C), temperature source Oral, resp. rate 17, weight 168 lb 4 oz (76.3 kg), SpO2 100 %.  LABORATORY DATA: Lab Results  Component Value Date   WBC 8.6 05/18/2022   HGB 16.5 05/18/2022   HCT 47.5 05/18/2022   MCV 86.8 05/18/2022   PLT 303 05/18/2022      Chemistry      Component Value Date/Time   NA 139 05/18/2022 1432   K 4.0 05/18/2022 1432   CL 102 05/18/2022 1432   CO2 29 05/18/2022 1432   BUN 9 05/18/2022 1432   CREATININE 1.19 05/18/2022 1432   CREATININE 1.19 03/07/2022 0810      Component Value Date/Time   CALCIUM 9.6 05/18/2022 1432   ALKPHOS 95 05/18/2022 1432   AST 47 (H) 05/18/2022 1432   AST 43 (H) 03/07/2022 0810   ALT 58 (H) 05/18/2022 1432   ALT 53 (H) 03/07/2022 0810   BILITOT 0.4 05/18/2022 1432   BILITOT 0.6 03/07/2022 0810       RADIOGRAPHIC STUDIES: CT Abdomen Pelvis W Wo Contrast  Result Date: 05/20/2022 CLINICAL DATA:  History of metastatic renal cell carcinoma status post right nephrectomy * Tracking Code: BO * . EXAM: CT CHEST WITHOUT CONTRAST CT ABDOMEN AND PELVIS WITH AND WITHOUT CONTRAST TECHNIQUE: Multidetector CT imaging of the chest was performed without intravenous contrast. Multidetector CT imaging of the abdomen and pelvis was performed following the  standard protocol before and during bolus administration of intravenous contrast. RADIATION DOSE REDUCTION: This exam was performed according to the departmental dose-optimization program which includes automated exposure control, adjustment of the mA and/or kV according to patient size and/or use of iterative reconstruction technique. CONTRAST:  15mL OMNIPAQUE IOHEXOL 300 MG/ML  SOLN COMPARISON:  Multiple priors including most recent CT December 22, 2021. FINDINGS: CT CHEST FINDINGS Cardiovascular: Aortic atherosclerosis. Normal size heart. No significant pericardial effusion/thickening. Mediastinum/Nodes: No suspicious thyroid nodule. No pathologically enlarged mediastinal, hilar or axillary lymph nodes, noting limited sensitivity for the detection of hilar adenopathy on this noncontrast study. Lungs/Pleura: Previously indexed pulmonary nodules are overall stable from prior examination. For reference: -Lateral right upper lobe pulmonary nodule measures 2.1 x 1.5 cm on image 62/3 previously 2.0 x 1.6 cm when remeasured for consistency. -Anterior right lower lobe pulmonary nodule measures 7 mm on image 91/3 previously 8 mm. A few new scattered tiny pulmonary nodules for reference: -Small cluster of adjacent tiny pulmonary nodules in the right middle lobe measure up to 4 mm on image 102/3. -superior segment left lower lobe pulmonary nodule measures 2 mm on image 66/3. No pleural effusion. No pneumothorax. Musculoskeletal: New expansile mixed lesion in the manubrium with breech of the posterior cortex on  image 35/3 and 71/5. CT ABDOMEN AND PELVIS FINDINGS Hepatobiliary: Decreased conspicuity of the focus of arterial enhancement in segment IV now measuring 6 mm on image 27/6 most recently seen on prior contrast enhanced CT dated September 01, 2020 and has been present dating back to CT October 26, 2019. No new suspicious hepatic lesion. Diffuse hepatic steatosis with focal fatty sparing along the gallbladder fossa.  Gallbladder is unremarkable. No biliary ductal dilation. Pancreas: No pancreatic ductal dilation or evidence of acute inflammation. Spleen: No splenomegaly or focal splenic lesion. Adrenals/Urinary Tract: Prior right nephrectomy and adrenalectomy without suspicious enhancing soft tissue nodularity in the nephrectomy bed. Left adrenal gland appears normal. Left kidney is unremarkable without hydronephrosis, nephrolithiasis or solid enhancing renal mass. Urinary bladder is unremarkable for degree of distension. Stomach/Bowel: No radiopaque enteric contrast material was administered. Stomach is unremarkable for degree of distension. No pathologic dilation of small or large bowel. Normal appendix. Colonic diverticulosis without findings of acute diverticulitis. Vascular/Lymphatic: Normal caliber abdominal aorta. Smooth IVC contours. No pathologically enlarged abdominal or pelvic lymph nodes. Reproductive: Prostate is unremarkable. Other: No significant abdominopelvic free fluid. Musculoskeletal: Multilevel degenerative changes spine. IMPRESSION: 1. New expansile mixed lesion in the manubrium with breech of the posterior cortex, compatible with osseous metastatic disease. 2. Previously indexed pulmonary nodules are overall stable from prior examination however there are a few new scattered tiny pulmonary nodules measuring up to 4 mm which are nonspecific but concerning for small metastasis. Consider attention on short-term interval follow-up dedicated chest CT. 3. Prior right nephrectomy and adrenalectomy without suspicious enhancing soft tissue nodularity in the nephrectomy bed. 4. Decreased conspicuity of the focus of arterial enhancement in segment IV now measuring 6 mm most recently seen on prior contrast enhanced CT dated September 01, 2020 and has been present dating back to CT October 26, 2019. Differential includes a metastasis versus a hepatic hemangioma, consider more definitive characterization by abdominal MRI. 5.  Diffuse hepatic steatosis with focal fatty sparing along the gallbladder fossa. 6. Colonic diverticulosis without findings of acute diverticulitis. 7.  Aortic Atherosclerosis (ICD10-I70.0). These results will be called to the ordering clinician or representative by the Radiologist Assistant, and communication documented in the PACS or [employer]. Electronically Signed   By: Dahlia Bailiff M.D.   On: 05/20/2022 16:46   CT Chest Wo Contrast  Result Date: 05/20/2022 CLINICAL DATA:  History of metastatic renal cell carcinoma status post right nephrectomy * Tracking Code: BO * . EXAM: CT CHEST WITHOUT CONTRAST CT ABDOMEN AND PELVIS WITH AND WITHOUT CONTRAST TECHNIQUE: Multidetector CT imaging of the chest was performed without intravenous contrast. Multidetector CT imaging of the abdomen and pelvis was performed following the standard protocol before and during bolus administration of intravenous contrast. RADIATION DOSE REDUCTION: This exam was performed according to the departmental dose-optimization program which includes automated exposure control, adjustment of the mA and/or kV according to patient size and/or use of iterative reconstruction technique. CONTRAST:  33mL OMNIPAQUE IOHEXOL 300 MG/ML  SOLN COMPARISON:  Multiple priors including most recent CT December 22, 2021. FINDINGS: CT CHEST FINDINGS Cardiovascular: Aortic atherosclerosis. Normal size heart. No significant pericardial effusion/thickening. Mediastinum/Nodes: No suspicious thyroid nodule. No pathologically enlarged mediastinal, hilar or axillary lymph nodes, noting limited sensitivity for the detection of hilar adenopathy on this noncontrast study. Lungs/Pleura: Previously indexed pulmonary nodules are overall stable from prior examination. For reference: -Lateral right upper lobe pulmonary nodule measures 2.1 x 1.5 cm on image 62/3 previously 2.0 x 1.6 cm when remeasured for  consistency. -Anterior right lower lobe pulmonary nodule measures 7  mm on image 91/3 previously 8 mm. A few new scattered tiny pulmonary nodules for reference: -Small cluster of adjacent tiny pulmonary nodules in the right middle lobe measure up to 4 mm on image 102/3. -superior segment left lower lobe pulmonary nodule measures 2 mm on image 66/3. No pleural effusion. No pneumothorax. Musculoskeletal: New expansile mixed lesion in the manubrium with breech of the posterior cortex on image 35/3 and 71/5. CT ABDOMEN AND PELVIS FINDINGS Hepatobiliary: Decreased conspicuity of the focus of arterial enhancement in segment IV now measuring 6 mm on image 27/6 most recently seen on prior contrast enhanced CT dated September 01, 2020 and has been present dating back to CT October 26, 2019. No new suspicious hepatic lesion. Diffuse hepatic steatosis with focal fatty sparing along the gallbladder fossa. Gallbladder is unremarkable. No biliary ductal dilation. Pancreas: No pancreatic ductal dilation or evidence of acute inflammation. Spleen: No splenomegaly or focal splenic lesion. Adrenals/Urinary Tract: Prior right nephrectomy and adrenalectomy without suspicious enhancing soft tissue nodularity in the nephrectomy bed. Left adrenal gland appears normal. Left kidney is unremarkable without hydronephrosis, nephrolithiasis or solid enhancing renal mass. Urinary bladder is unremarkable for degree of distension. Stomach/Bowel: No radiopaque enteric contrast material was administered. Stomach is unremarkable for degree of distension. No pathologic dilation of small or large bowel. Normal appendix. Colonic diverticulosis without findings of acute diverticulitis. Vascular/Lymphatic: Normal caliber abdominal aorta. Smooth IVC contours. No pathologically enlarged abdominal or pelvic lymph nodes. Reproductive: Prostate is unremarkable. Other: No significant abdominopelvic free fluid. Musculoskeletal: Multilevel degenerative changes spine. IMPRESSION: 1. New expansile mixed lesion in the manubrium with breech  of the posterior cortex, compatible with osseous metastatic disease. 2. Previously indexed pulmonary nodules are overall stable from prior examination however there are a few new scattered tiny pulmonary nodules measuring up to 4 mm which are nonspecific but concerning for small metastasis. Consider attention on short-term interval follow-up dedicated chest CT. 3. Prior right nephrectomy and adrenalectomy without suspicious enhancing soft tissue nodularity in the nephrectomy bed. 4. Decreased conspicuity of the focus of arterial enhancement in segment IV now measuring 6 mm most recently seen on prior contrast enhanced CT dated September 01, 2020 and has been present dating back to CT October 26, 2019. Differential includes a metastasis versus a hepatic hemangioma, consider more definitive characterization by abdominal MRI. 5. Diffuse hepatic steatosis with focal fatty sparing along the gallbladder fossa. 6. Colonic diverticulosis without findings of acute diverticulitis. 7.  Aortic Atherosclerosis (ICD10-I70.0). These results will be called to the ordering clinician or representative by the Radiologist Assistant, and communication documented in the PACS or [employer]. Electronically Signed   By: Dahlia Bailiff M.D.   On: 05/20/2022 16:46    ASSESSMENT AND PLAN: This is a very pleasant 43 years old male with stage IV clear-cell renal cell carcinoma with rhabdoid features and pulmonary involvement diagnosed in April 2021 status post right radical nephrectomy on June 12, 2019 and the tumor measured 8.5 cm with clear-cell renal cell carcinoma with rhabdoid features. He was then started on treatment with immunotherapy with ipilimumab 1 Mg/KG and nivolumab 3 mg/KG every 3 weeks started March 01, 2020 for 4 cycles completed on May 04, 2020 followed by maintenance treatment with nivolumab 480 Mg IV every 4 weeks in addition to Cabometyx 40 Mg p.o. daily on June 01, 2020 and his dose of Cabometyx was reduced to 20 mg  p.o. daily in May 2022. The patient  is currently on treatment with axitinib 5 mg p.o. daily started initially in June 2023 as 5 mg p.o. twice daily and reduced to 5 mg p.o. daily in August 2023 secondary to significant blisters of the foot. The patient has been tolerating this treatment well with no concerning adverse effects. Repeat CT scan of the chest, abdomen and pelvis performed recently showed new expansible makes it lesion in the manubrium with breach of the posterior cortex compatible with osseous metastatic disease.  He also has some increased and some of the pulmonary nodules suspicious for early disease progression. I personally and independently reviewed the scan images and discussed the results with the patient today. I recommended for him to continue his current treatment with axitinib with the same dose for now. He is currently asymptomatic from the manubrium bone lesion but I would consider referring him to radiation oncology if he became symptomatic. I will monitor him closely with repeat blood work in 1 months and consideration for repeating CT scan of the chest, abdomen and pelvis in around 2 months from now and if he has any further disease progression, we will discontinue axitinib and consider The patient for other treatment options. The patient is in agreement with the current plan. He was advised to call immediately if he has any other concerning symptoms in the interval. The patient voices understanding of current disease status and treatment options and is in agreement with the current care plan.  All questions were answered. The patient knows to call the clinic with any problems, questions or concerns. We can certainly see the patient much sooner if necessary.  The total time spent in the appointment was 30 minutes.  Disclaimer: This note was dictated with voice recognition software. Similar sounding words can inadvertently be transcribed and may not be corrected upon  review.

## 2022-05-31 ENCOUNTER — Other Ambulatory Visit (HOSPITAL_COMMUNITY): Payer: Self-pay

## 2022-06-28 ENCOUNTER — Other Ambulatory Visit (HOSPITAL_COMMUNITY): Payer: Self-pay

## 2022-06-29 ENCOUNTER — Other Ambulatory Visit (HOSPITAL_COMMUNITY): Payer: Self-pay

## 2022-07-04 ENCOUNTER — Other Ambulatory Visit: Payer: Self-pay

## 2022-08-15 ENCOUNTER — Other Ambulatory Visit: Payer: Self-pay

## 2023-09-18 ENCOUNTER — Inpatient Hospital Stay
Admission: RE | Admit: 2023-09-18 | Discharge: 2023-09-18 | Disposition: A | Discharge status: Home / Self Care | Payer: Self-pay | Source: Ambulatory Visit | Attending: Radiation Oncology | Admitting: Radiation Oncology

## 2023-09-18 ENCOUNTER — Other Ambulatory Visit: Payer: Self-pay | Admitting: Radiation Therapy

## 2023-09-18 ENCOUNTER — Other Ambulatory Visit: Payer: Self-pay | Admitting: Radiation Oncology

## 2023-09-18 DIAGNOSIS — C641 Malignant neoplasm of right kidney, except renal pelvis: Secondary | ICD-10-CM

## 2023-09-19 ENCOUNTER — Telehealth: Payer: Self-pay | Admitting: Radiation Oncology

## 2023-09-19 NOTE — Progress Notes (Signed)
 Radiation Oncology         [phone] ________________________________  Initial Outpatient Consultation  Name: Gordon Thomas MRN: [id number]  Date: 09/20/2023  DOB: 1980-06-15  RR:Dyzounw, Suzen, MD  [name], MD   REFERRING PHYSICIAN: [name], MD  DIAGNOSIS: No diagnosis found.   Cancer Staging  Renal cell cancer, right (HCC) Staging form: Kidney, AJCC 8th Edition - Clinical: Stage IV (cTX, cNX, pM1) - Signed by [name], MD on 07/13/2020   CHIEF COMPLAINT: Here to discuss management of *** cancer  HISTORY OF PRESENT ILLNESS::Gordon Thomas is a 43 y.o. 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 Two lymph nodes were also excised which were negative for carcinoma.   He later returned for a post-surgery restaging scan on 02/16/20 which unfortunately showed evidence of locally recurrent and metastatic renal cell carcinoma, characterized by multiple new and enlarged pulmonary nodules throughout the lungs; multiple new heterogeneously enhancing pleural nodules about the right lung base; a heterogeneously enhancing soft tissue nodule in the vicinity of the right adrenal gland.   He was subsequently referred to medical oncology and began first line therapy with Ipilimumab  and nivolumab  which he completed on 05/04/20. Imaging on 05/26/20 showed a mixed response to therapy and Cabozantinib  was subsequently added to his regimen. His disease unfortunately progressed again and he began third line treatment with Axitinib  in June of 2023.    His next restaging scan on 05/18/22 showed oligoprogressive disease with a new expansile lesion in the manubrium. He was advised to continue with Axitinib  and received SBRT to the  osseous metastatic disease in the sternum in May of 2024.  His disease unfortunately again progressed prompting fourth line therapy consisting of belzutifan in September of 2024.   He did not tolerate belzutifan well and developed severe anemia and grade I hypoxia. His disease also did not respond well to belzutifan based on a restaging scan performed on 02/21/23.   He was then started on 5th line therapy with tivozanib in early 2025.     He unfortunately developed worsening back pain prompting a CT CAP on 08/15/23 which showed progression of disease characterized by new and increasing metastatic lesions ***.   Lenvima + everolimus was subsequently added to his 5th line therapy regimen.   PREVIOUS RADIATION THERAPY: Yes   SBRT to the osseous metastatic disease in the sternum in May of 2024  Right temporal lobe treated on 09/11/23 with 20 Gy to the 85% Isodose line    PAST MEDICAL HISTORY:  has a past medical history of Anxiety and Depression.    PAST SURGICAL HISTORY: Past Surgical History:  Procedure Laterality Date   NO PAST SURGERIES     ROBOT ASSISTED LAPAROSCOPIC NEPHRECTOMY Right 06/12/2019   Procedure: XI ROBOTIC ASSISTED LAPAROSCOPIC NEPHRECTOMY;  Surgeon: Devere Lonni Righter, MD;  Location: [facility];  Service: Urology;  Laterality: Right;    FAMILY HISTORY: family history is not on file.  SOCIAL HISTORY:  reports that he has never smoked. He has never used smokeless tobacco. He reports current alcohol use. He reports that he does not use drugs.  ALLERGIES: Patient has no known allergies.  MEDICATIONS:  Current Outpatient Medications  Medication Sig Dispense Refill   albuterol  (VENTOLIN  HFA) 108 (90 Base)  MCG/ACT inhaler Inhale 1-2 puffs into the lungs every 6 (six) hours as needed for wheezing or shortness of breath.     axitinib  (INLYTA ) 5 MG tablet Take 1 tablet (5 mg total) by mouth 2 (two) times daily. 60 tablet 1   docusate sodium  (COLACE) 100 MG capsule Take  1 capsule (100 mg total) by mouth 2 (two) times daily. (Patient not taking: Reported on 05/22/2022)     prochlorperazine  (COMPAZINE ) 10 MG tablet Take 1 tablet (10 mg total) by mouth every 6 (six) hours as needed for nausea or vomiting. (Patient not taking: Reported on 05/22/2022) 30 tablet 0   promethazine  (PHENERGAN ) 12.5 MG tablet Take 1 tablet (12.5 mg total) by mouth every 4 (four) hours as needed for nausea or vomiting. (Patient not taking: Reported on 05/22/2022) 15 tablet 0   No current facility-administered medications for this encounter.    REVIEW OF SYSTEMS:  Notable for that above.   PHYSICAL EXAM:  vitals were not taken for this visit.   General: Alert and oriented, in no acute distress *** HEENT: Head is normocephalic. Extraocular movements are intact. Oropharynx is clear. Neck: Neck is supple, no palpable cervical or supraclavicular lymphadenopathy. Heart: Regular in rate and rhythm with no murmurs, rubs, or gallops. Chest: Clear to auscultation bilaterally, with no rhonchi, wheezes, or rales. Abdomen: Soft, nontender, nondistended, with no rigidity or guarding. Extremities: No cyanosis or edema. Lymphatics: see Neck Exam Skin: No concerning lesions. Musculoskeletal: symmetric strength and muscle tone throughout. Neurologic: Cranial nerves II through XII are grossly intact. No obvious focalities. Speech is fluent. Coordination is intact. Psychiatric: Judgment and insight are intact. Affect is appropriate.   ECOG = ***  0 - Asymptomatic (Fully active, able to carry on all predisease activities without restriction)  1 - Symptomatic but completely ambulatory (Restricted in physically strenuous activity but ambulatory and able to carry out work of a light or sedentary nature. For example, light housework, office work)  2 - Symptomatic, <50% in bed during the day (Ambulatory and capable of all self care but unable to carry out any work activities. Up and about more than 50% of  waking hours)  3 - Symptomatic, >50% in bed, but not bedbound (Capable of only limited self-care, confined to bed or chair 50% or more of waking hours)  4 - Bedbound (Completely disabled. Cannot carry on any self-care. Totally confined to bed or chair)  5 - Death   Raylene MM, Creech RH, Tormey DC, et al. [phone]). Toxicity and response criteria of the [facility]. Am. DOROTHA Bridges. Oncol. 5 (6): 649-55   LABORATORY DATA:  Lab Results  Component Value Date   WBC 8.6 05/18/2022   HGB 16.5 05/18/2022   HCT 47.5 05/18/2022   MCV 86.8 05/18/2022   PLT 303 05/18/2022   CMP     Component Value Date/Time   NA 139 05/18/2022 1432   K 4.0 05/18/2022 1432   CL 102 05/18/2022 1432   CO2 29 05/18/2022 1432   GLUCOSE 96 05/18/2022 1432   BUN 9 05/18/2022 1432   CREATININE 1.19 05/18/2022 1432   CREATININE 1.19 03/07/2022 0810   CALCIUM 9.6 05/18/2022 1432   PROT 8.8 (H) 05/18/2022 1432   ALBUMIN  4.6 05/18/2022 1432   AST 47 (H) 05/18/2022 1432   AST 43 (H) 03/07/2022 0810   ALT 58 (H) 05/18/2022 1432   ALT 53 (H) 03/07/2022 0810   ALKPHOS 95 05/18/2022 1432   BILITOT 0.4 05/18/2022 1432  BILITOT 0.6 03/07/2022 0810   GFRNONAA >60 05/18/2022 1432   GFRNONAA >60 03/07/2022 0810         RADIOGRAPHY: No results found.    IMPRESSION/PLAN:***    On date of service, in total, I spent *** minutes on this encounter. Patient was seen in person.   __________________________________________   Lauraine Golden, MD  This document serves as a record of services personally performed by Lauraine Golden, MD. It was created on her behalf by Dorthy Fuse, a trained medical scribe. The creation of this record is based on the scribe's personal observations and the provider's statements to them. This document has been checked and approved by the attending provider.

## 2023-09-19 NOTE — Telephone Encounter (Addendum)
 Spoke to Puako at [employer] regarding record request. Nena provided c/b number and advised she would send records asap.Will f/u before EOD to obtain records and hopefully schedule pt.

## 2023-09-20 ENCOUNTER — Ambulatory Visit
Admission: RE | Admit: 2023-09-20 | Discharge: 2023-09-20 | Disposition: A | Discharge status: Home / Self Care | Source: Ambulatory Visit | Attending: Radiation Oncology | Admitting: Radiation Oncology

## 2023-09-20 DIAGNOSIS — C7951 Secondary malignant neoplasm of bone: Secondary | ICD-10-CM | POA: Insufficient documentation

## 2023-09-20 DIAGNOSIS — C7931 Secondary malignant neoplasm of brain: Secondary | ICD-10-CM | POA: Insufficient documentation

## 2023-09-20 NOTE — Progress Notes (Signed)
 Histology and Location of Primary Cancer:  Right Renal Carcinoma with Brain Metastasis  Location(s) of Symptomatic tumor(s):  Brain  Scans MRI Brain W and WO Contrast 08/24/2023   MRI Spine Lumbar W and WO Contrast 08/24/2023     Surgeries  Right Radical Nephrectomy on 06/12/2019 Past/Anticipated chemotherapy by medical oncology, if any:  Most recent  Levatinib/ Everolimus+ Gamma Knife radiation with most recent fraction on 6/17  SBRT to the Osseous Metastatic Disease in the Sternum on May 2024 Right Temporal Lobe treated with 20 Gy to 85% Isodose line on 09/11/2023  Patient's main complaints related to symptomatic tumor(s) are:  Cristobal, 08/30/2023 Body aches and altered mental status, admitted for acute metabolic encephalopathy and fever.  Fever, fatigue, body aches and altered mental status admitted for acute metabolic encephalopathy and fever    Pain on a scale of 0-10 is: None    If Spine Met(s), symptoms, if any, include: Bowel/Bladder retention or incontinence (please describe): Yes Numbness or weakness in extremities (please describe): Yes,  Current Decadron  regimen, if applicable: None  Ambulatory status? Walker? Wheelchair?: None  SAFETY ISSUES: Prior radiation? Yes Pacemaker/ICD? None Possible current pregnancy? N/A Is the patient on methotrexate? None  Additional Complaints / other details:   None

## 2023-09-23 ENCOUNTER — Inpatient Hospital Stay: Attending: Radiation Oncology

## 2023-10-04 DEATH — deceased
